# Patient Record
Sex: Female | Born: 2000 | Hispanic: Yes | Marital: Single | Smoking: Never smoker
Health system: Southern US, Community
[De-identification: ages and names within clinical notes are randomized; demographics above are authoritative.]

## PROBLEM LIST (undated history)

## (undated) DIAGNOSIS — N83209 Unspecified ovarian cyst, unspecified side: Secondary | ICD-10-CM

## (undated) HISTORY — PX: WISDOM TOOTH EXTRACTION: SHX21

---
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
## 2018-01-02 ENCOUNTER — Encounter: Payer: Self-pay | Admitting: Advanced Practice Midwife

## 2018-01-02 ENCOUNTER — Ambulatory Visit (INDEPENDENT_AMBULATORY_CARE_PROVIDER_SITE_OTHER): Payer: Medicaid Other | Admitting: Advanced Practice Midwife

## 2018-01-02 ENCOUNTER — Encounter (HOSPITAL_COMMUNITY): Payer: Self-pay

## 2018-01-02 VITALS — BP 126/77 | HR 92 | Ht 62.0 in | Wt 152.0 lb

## 2018-01-02 DIAGNOSIS — D270 Benign neoplasm of right ovary: Secondary | ICD-10-CM | POA: Diagnosis not present

## 2018-01-02 NOTE — Patient Instructions (Signed)
Ovarian Cyst An ovarian cyst is a fluid-filled sac that forms on an ovary. The ovaries are small organs that produce eggs in women. Various types of cysts can form on the ovaries. Some may cause symptoms and require treatment. Most ovarian cysts go away on their own, are not cancerous (are benign), and do not cause problems. Common types of ovarian cysts include:  Functional (follicle) cysts. ? Occur during the menstrual cycle, and usually go away with the next menstrual cycle if you do not get pregnant. ? Usually cause no symptoms.  Endometriomas. ? Are cysts that form from the tissue that lines the uterus (endometrium). ? Are sometimes called "chocolate cysts" because they become filled with blood that turns brown. ? Can cause pain in the lower abdomen during intercourse and during your period.  Cystadenoma cysts. ? Develop from cells on the outside surface of the ovary. ? Can get very large and cause lower abdomen pain and pain with intercourse. ? Can cause severe pain if they twist or break open (rupture).  Dermoid cysts. ? Are sometimes found in both ovaries. ? May contain different kinds of body tissue, such as skin, teeth, hair, or cartilage. ? Usually do not cause symptoms unless they get very big.  Theca lutein cysts. ? Occur when too much of a certain hormone (human chorionic gonadotropin) is produced and overstimulates the ovaries to produce an egg. ? Are most common after having procedures used to assist with the conception of a baby (in vitro fertilization).  What are the causes? Ovarian cysts may be caused by:  Ovarian hyperstimulation syndrome. This is a condition that can develop from taking fertility medicines. It causes multiple large ovarian cysts to form.  Polycystic ovarian syndrome (PCOS). This is a common hormonal disorder that can cause ovarian cysts, as well as problems with your period or fertility.  What increases the risk? The following factors may make  you more likely to develop ovarian cysts:  Being overweight or obese.  Taking fertility medicines.  Taking certain forms of hormonal birth control.  Smoking.  What are the signs or symptoms? Many ovarian cysts do not cause symptoms. If symptoms are present, they may include:  Pelvic pain or pressure.  Pain in the lower abdomen.  Pain during sex.  Abdominal swelling.  Abnormal menstrual periods.  Increasing pain with menstrual periods.  How is this diagnosed? These cysts are commonly found during a routine pelvic exam. You may have tests to find out more about the cyst, such as:  Ultrasound.  X-ray of the pelvis.  CT scan.  MRI.  Blood tests.  How is this treated? Many ovarian cysts go away on their own without treatment. Your health care provider may want to check your cyst regularly for 2-3 months to see if it changes. If you are in menopause, it is especially important to have your cyst monitored closely because menopausal women have a higher rate of ovarian cancer. When treatment is needed, it may include:  Medicines to help relieve pain.  A procedure to drain the cyst (aspiration).  Surgery to remove the whole cyst.  Hormone treatment or birth control pills. These methods are sometimes used to help dissolve a cyst.  Follow these instructions at home:  Take over-the-counter and prescription medicines only as told by your health care provider.  Do not drive or use heavy machinery while taking prescription pain medicine.  Get regular pelvic exams and Pap tests as often as told by your health care   provider.  Return to your normal activities as told by your health care provider. Ask your health care provider what activities are safe for you.  Do not use any products that contain nicotine or tobacco, such as cigarettes and e-cigarettes. If you need help quitting, ask your health care provider.  Keep all follow-up visits as told by your health care provider.  This is important. Contact a health care provider if:  Your periods are late, irregular, or painful, or they stop.  You have pelvic pain that does not go away.  You have pressure on your bladder or trouble emptying your bladder completely.  You have pain during sex.  You have any of the following in your abdomen: ? A feeling of fullness. ? Pressure. ? Discomfort. ? Pain that does not go away. ? Swelling.  You feel generally ill.  You become constipated.  You lose your appetite.  You develop severe acne.  You start to have more body hair and facial hair.  You are gaining weight or losing weight without changing your exercise and eating habits.  You think you may be pregnant. Get help right away if:  You have abdominal pain that is severe or gets worse.  You cannot eat or drink without vomiting.  You suddenly develop a fever.  Your menstrual period is much heavier than usual. This information is not intended to replace advice given to you by your health care provider. Make sure you discuss any questions you have with your health care provider. Document Released: 07/29/2005 Document Revised: 02/16/2016 Document Reviewed: 12/31/2015 Elsevier Interactive Patient Education  2018 [employer]  

## 2018-01-02 NOTE — Progress Notes (Signed)
Patient complaining of right lower quadrant pain for three months. Was evaluated at the hospital. Kathrene Alu RN

## 2018-01-02 NOTE — Progress Notes (Signed)
GYNECOLOGY CLINIC ANNUAL PREVENTATIVE CARE ENCOUNTER NOTE  Subjective:   Beth Vaughn is a 17 y.o. 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  States has pain and pressure in RLQ since Mid-March.  No change in menses.  No fever or other symptoms currently but did have nausea and low appetite before.  Denies abnormal vaginal bleeding, discharge, or other gynecologic concerns.   Was seen in ED in March as described below.  Had [place] and CT diagnosis of a large teratoma on right.  States Medicaid runs out on May 31 due to her age.   ER Note: 17yo female presents with concern for RLQ abdominal pain Intermittent for 2 weeks, lasting 1-2hr at a time and resolving until last night was severe, constant pain that did not improve. Continues to be severe 8-9/10.  Assoc nausea, low appetite. No vomiting, no diarrhea or constipation. Recent menses. No discharge. Not sexually active.    Gynecologic History Patient's last menstrual period was 12/30/2017. Contraception: abstinence Last Pap: none  Obstetric History OB History  Gravida Para Term Preterm AB Living  0 0 0 0 0 0  SAB TAB Ectopic Multiple Live Births  0 0 0 0 0    History reviewed. No pertinent past medical history.  History reviewed. No pertinent surgical history.  Current Outpatient Medications on File Prior to Visit  Medication Sig Dispense Refill  . ondansetron (ZOFRAN ODT) 4 MG disintegrating tablet Take 1 tablet (4 mg total) by mouth every 8 (eight) hours as needed for nausea or vomiting. (Patient not taking: Reported on 01/02/2018) 16 tablet 0   No current facility-administered medications on file prior to visit.     Allergies  Allergen Reactions  . Penicillins     Social History   Socioeconomic History  . Marital status: Single    Spouse name: Not on file  . Number of children: Not on file  . Years of education: Not on file  . Highest education level: Not on file  Occupational History  . Not  on file  Social Needs  . Financial resource strain: Not on file  . Food insecurity:    Worry: Not on file    Inability: Not on file  . Transportation needs:    Medical: Not on file    Non-medical: Not on file  Tobacco Use  . Smoking status: Never Smoker  . Smokeless tobacco: Never Used  Substance and Sexual Activity  . Alcohol use: Never    Frequency: Never  . Drug use: Never  . Sexual activity: Never  Lifestyle  . Physical activity:    Days per week: Not on file    Minutes per session: Not on file  . Stress: Not on file  Relationships  . Social connections:    Talks on phone: Not on file    Gets together: Not on file    Attends religious service: Not on file    Active member of club or organization: Not on file    Attends meetings of clubs or organizations: Not on file    Relationship status: Not on file  . Intimate partner violence:    Fear of current or ex partner: Not on file    Emotionally abused: Not on file    Physically abused: Not on file    Forced sexual activity: Not on file  Other Topics Concern  . Not on file  Social History Narrative  . Not on file    Family History  Problem Relation Age of Onset  . Hypertension Mother   . Cancer Neg Hx   . Diabetes Neg Hx     The following portions of the patient's history were reviewed and updated as appropriate: allergies, current medications, past family history, past medical history, past social history, past surgical history and problem list.  Review of Systems Pertinent items noted in HPI and remainder of comprehensive ROS otherwise negative.   Objective:  BP 126/77   Pulse 92   Ht 5\' 2"  (1.575 m)   Wt 152 lb (68.9 kg)   LMP 12/30/2017   BMI 27.80 kg/m  CONSTITUTIONAL: Well-developed, well-nourished female in no acute distress.  NECK: Normal range of motion, supple, no masses.   SKIN: Skin is warm and dry. No rash noted. Not diaphoretic. No erythema. No pallor. [place]: Alert and oriented to  person, place, and time  PSYCHIATRIC: Normal mood and affect. Normal behavior. Normal judgment and thought content. CARDIOVASCULAR: Normal heart rate noted, regular rhythm RESPIRATORY: Effort normal, no problems with respiration noted. ABDOMEN: Soft, normal bowel sounds, no distention noted.  There is moderate tenderness and fullness in right lower quadrant.  LLQ normal   PELVIC: Deferred today. MUSCULOSKELETAL: Normal range of motion. No tenderness.  No cyanosis, clubbing, or edema.  2+ distal pulses.  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 This has mass effect upon both ureters. The size of this mass may predispose the patient to ovarian torsion. If there is a clinical concern for ovarian torsion pelvic sonogram with ovarian Doppler may be helpful. Otherwise, gynecologic consultation is advised.  [place]: Measurements: 10.9 x 7.7 x 11.5 cm. The patient's known teratoma appears to arise from the right ovary. The teratoma measures 8.3 x 6.2 x 8.9 cm. Left ovary Measurements: 3.3 x 2.2 x 3.1 cm. Normal appearance/no adnexal mass. Pulsed Doppler evaluation demonstrates normal low-resistance arterial and venous waveforms in both ovaries. Other: No other abnormalities.  IMPRESSION: 1. The patient has known teratoma appears to arise from the right ovary. Arterial and venous blood flow is seen within the ovarian tissue.  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

## 2018-01-06 ENCOUNTER — Encounter (HOSPITAL_COMMUNITY): Payer: Self-pay | Admitting: *Deleted

## 2018-01-06 ENCOUNTER — Other Ambulatory Visit: Payer: Self-pay

## 2018-01-07 ENCOUNTER — Encounter: Payer: Self-pay | Admitting: Obstetrics & Gynecology

## 2018-01-07 ENCOUNTER — Ambulatory Visit: Payer: Medicaid Other | Admitting: Obstetrics & Gynecology

## 2018-01-08 ENCOUNTER — Ambulatory Visit (HOSPITAL_COMMUNITY): Payer: Medicaid Other | Admitting: Anesthesiology

## 2018-01-08 ENCOUNTER — Ambulatory Visit (HOSPITAL_COMMUNITY)
Admission: RE | Admit: 2018-01-08 | Discharge: 2018-01-08 | Disposition: A | Discharge status: Home / Self Care | Payer: Medicaid Other | Source: Ambulatory Visit | Attending: Obstetrics & Gynecology | Admitting: Obstetrics & Gynecology

## 2018-01-08 ENCOUNTER — Encounter (HOSPITAL_COMMUNITY): Payer: Self-pay

## 2018-01-08 ENCOUNTER — Encounter (HOSPITAL_COMMUNITY)
Admission: RE | Disposition: A | Discharge status: Home / Self Care | Payer: Self-pay | Source: Ambulatory Visit | Attending: Obstetrics & Gynecology

## 2018-01-08 ENCOUNTER — Other Ambulatory Visit: Payer: Self-pay

## 2018-01-08 DIAGNOSIS — Z880 Allergy status to penicillin: Secondary | ICD-10-CM | POA: Insufficient documentation

## 2018-01-08 DIAGNOSIS — D259 Leiomyoma of uterus, unspecified: Secondary | ICD-10-CM

## 2018-01-08 DIAGNOSIS — D270 Benign neoplasm of right ovary: Secondary | ICD-10-CM

## 2018-01-08 HISTORY — PX: LAPAROSCOPIC OVARIAN CYSTECTOMY: SHX6248

## 2018-01-08 HISTORY — DX: Unspecified ovarian cyst, unspecified side: N83.209

## 2018-01-08 LAB — CBC
HEMATOCRIT: 41.5 % (ref 36.0–46.0)
Hemoglobin: 14 g/dL (ref 12.0–15.0)
MCH: 31.2 pg (ref 26.0–34.0)
MCHC: 33.7 g/dL (ref 30.0–36.0)
MCV: 92.4 fL (ref 78.0–100.0)
Platelets: 198 10*3/uL (ref 150–400)
RBC: 4.49 MIL/uL (ref 3.87–5.11)
RDW: 12 % (ref 11.5–15.5)
WBC: 8.6 10*3/uL (ref 4.0–10.5)

## 2018-01-08 LAB — PREGNANCY, URINE: PREG TEST UR: NEGATIVE

## 2018-01-08 SURGERY — EXCISION, CYST, OVARY, LAPAROSCOPIC
Anesthesia: General | Site: Abdomen | Laterality: Right

## 2018-01-08 MED ORDER — MIDAZOLAM HCL 2 MG/2ML IJ SOLN
INTRAMUSCULAR | Status: AC
  Filled 2018-01-08: qty 2

## 2018-01-08 MED ORDER — SCOPOLAMINE 1 MG/3DAYS TD PT72
MEDICATED_PATCH | TRANSDERMAL | Status: AC
  Administered 2018-01-08: 1.5 mg via TRANSDERMAL
  Filled 2018-01-08: qty 1

## 2018-01-08 MED ORDER — SODIUM CHLORIDE 0.9 % IR SOLN
Status: DC | PRN
  Administered 2018-01-08: 3000 mL

## 2018-01-08 MED ORDER — PROPOFOL 10 MG/ML IV BOLUS
INTRAVENOUS | Status: DC | PRN
  Administered 2018-01-08: 150 mg via INTRAVENOUS

## 2018-01-08 MED ORDER — LIDOCAINE HCL (CARDIAC) PF 100 MG/5ML IV SOSY
PREFILLED_SYRINGE | INTRAVENOUS | Status: DC | PRN
  Administered 2018-01-08: 80 mg via INTRAVENOUS

## 2018-01-08 MED ORDER — ONDANSETRON HCL 4 MG/2ML IJ SOLN: 4.0000 mg | Freq: Once | INTRAMUSCULAR | Status: DC | PRN

## 2018-01-08 MED ORDER — ROCURONIUM BROMIDE 100 MG/10ML IV SOLN
INTRAVENOUS | Status: AC
  Filled 2018-01-08: qty 1

## 2018-01-08 MED ORDER — MIDAZOLAM HCL 2 MG/2ML IJ SOLN
INTRAMUSCULAR | Status: DC | PRN
  Administered 2018-01-08: 2 mg via INTRAVENOUS

## 2018-01-08 MED ORDER — FENTANYL CITRATE (PF) 250 MCG/5ML IJ SOLN
INTRAMUSCULAR | Status: AC
  Filled 2018-01-08: qty 5

## 2018-01-08 MED ORDER — LIDOCAINE HCL (CARDIAC) PF 100 MG/5ML IV SOSY
PREFILLED_SYRINGE | INTRAVENOUS | Status: AC
  Filled 2018-01-08: qty 5

## 2018-01-08 MED ORDER — ONDANSETRON HCL 4 MG/2ML IJ SOLN
INTRAMUSCULAR | Status: AC
  Filled 2018-01-08: qty 2

## 2018-01-08 MED ORDER — HYDROMORPHONE HCL 1 MG/ML IJ SOLN
INTRAMUSCULAR | Status: DC | PRN
  Administered 2018-01-08: 1 mg via INTRAVENOUS

## 2018-01-08 MED ORDER — OXYCODONE HCL 5 MG/5ML PO SOLN: 5.0000 mg | Freq: Once | ORAL | Status: DC | PRN

## 2018-01-08 MED ORDER — FENTANYL CITRATE (PF) 100 MCG/2ML IJ SOLN
25.0000 ug | INTRAMUSCULAR | Status: DC | PRN
  Administered 2018-01-08: 50 ug via INTRAVENOUS

## 2018-01-08 MED ORDER — FENTANYL CITRATE (PF) 100 MCG/2ML IJ SOLN
INTRAMUSCULAR | Status: AC
  Filled 2018-01-08: qty 2

## 2018-01-08 MED ORDER — BUPIVACAINE HCL (PF) 0.5 % IJ SOLN
INTRAMUSCULAR | Status: DC | PRN
  Administered 2018-01-08: 14 mL
  Administered 2018-01-08: 2 mL
  Administered 2018-01-08: 14 mL

## 2018-01-08 MED ORDER — MEPERIDINE HCL 25 MG/ML IJ SOLN: 6.2500 mg | INTRAMUSCULAR | Status: DC | PRN

## 2018-01-08 MED ORDER — ACETAMINOPHEN 160 MG/5ML PO SOLN: 325.0000 mg | ORAL | Status: DC | PRN

## 2018-01-08 MED ORDER — KETOROLAC TROMETHAMINE 30 MG/ML IJ SOLN
INTRAMUSCULAR | Status: DC | PRN
  Administered 2018-01-08: 30 mg via INTRAVENOUS

## 2018-01-08 MED ORDER — GENTAMICIN SULFATE 40 MG/ML IJ SOLN
INTRAVENOUS | Status: AC
  Administered 2018-01-08: 290 mg via INTRAVENOUS
  Filled 2018-01-08: qty 7.25

## 2018-01-08 MED ORDER — BUPIVACAINE HCL (PF) 0.5 % IJ SOLN
INTRAMUSCULAR | Status: AC
  Filled 2018-01-08: qty 30

## 2018-01-08 MED ORDER — PROPOFOL 10 MG/ML IV BOLUS
INTRAVENOUS | Status: AC
  Filled 2018-01-08: qty 20

## 2018-01-08 MED ORDER — SOD CITRATE-CITRIC ACID 500-334 MG/5ML PO SOLN
30.0000 mL | ORAL | Status: AC
  Administered 2018-01-08: 30 mL via ORAL

## 2018-01-08 MED ORDER — HYDROMORPHONE HCL 1 MG/ML IJ SOLN
INTRAMUSCULAR | Status: AC
  Filled 2018-01-08: qty 1

## 2018-01-08 MED ORDER — SUGAMMADEX SODIUM 200 MG/2ML IV SOLN
INTRAVENOUS | Status: DC | PRN
  Administered 2018-01-08: 200 mg via INTRAVENOUS

## 2018-01-08 MED ORDER — SCOPOLAMINE 1 MG/3DAYS TD PT72
1.0000 | MEDICATED_PATCH | Freq: Once | TRANSDERMAL | Status: DC
  Administered 2018-01-08: 1.5 mg via TRANSDERMAL

## 2018-01-08 MED ORDER — IBUPROFEN 600 MG PO TABS: 600.0000 mg | ORAL_TABLET | Freq: Four times a day (QID) | ORAL | 2 refills | Status: AC | PRN

## 2018-01-08 MED ORDER — DEXAMETHASONE SODIUM PHOSPHATE 4 MG/ML IJ SOLN
INTRAMUSCULAR | Status: AC
  Filled 2018-01-08: qty 1

## 2018-01-08 MED ORDER — DEXAMETHASONE SODIUM PHOSPHATE 4 MG/ML IJ SOLN
INTRAMUSCULAR | Status: DC | PRN
  Administered 2018-01-08: 4 mg via INTRAVENOUS

## 2018-01-08 MED ORDER — OXYCODONE-ACETAMINOPHEN 5-325 MG PO TABS: 1.0000 | ORAL_TABLET | ORAL | 0 refills | Status: DC | PRN

## 2018-01-08 MED ORDER — SUGAMMADEX SODIUM 200 MG/2ML IV SOLN
INTRAVENOUS | Status: AC
  Filled 2018-01-08: qty 2

## 2018-01-08 MED ORDER — ROCURONIUM BROMIDE 100 MG/10ML IV SOLN
INTRAVENOUS | Status: DC | PRN
  Administered 2018-01-08 (×2): 10 mg via INTRAVENOUS
  Administered 2018-01-08: 5 mg via INTRAVENOUS
  Administered 2018-01-08: 40 mg via INTRAVENOUS

## 2018-01-08 MED ORDER — ONDANSETRON 4 MG PO TBDP: 4.0000 mg | ORAL_TABLET | Freq: Four times a day (QID) | ORAL | 0 refills | Status: DC | PRN

## 2018-01-08 MED ORDER — LACTATED RINGERS IV SOLN: INTRAVENOUS | Status: DC

## 2018-01-08 MED ORDER — ONDANSETRON HCL 4 MG/2ML IJ SOLN
INTRAMUSCULAR | Status: DC | PRN
Start: 2018-01-08 — End: 2018-01-08
  Administered 2018-01-08: 4 mg via INTRAVENOUS

## 2018-01-08 MED ORDER — SUGAMMADEX SODIUM 200 MG/2ML IV SOLN
INTRAVENOUS | Status: AC
  Filled 2018-01-08: qty 4

## 2018-01-08 MED ORDER — FENTANYL CITRATE (PF) 100 MCG/2ML IJ SOLN
INTRAMUSCULAR | Status: DC | PRN
  Administered 2018-01-08 (×2): 50 ug via INTRAVENOUS
  Administered 2018-01-08: 100 ug via INTRAVENOUS
  Administered 2018-01-08: 50 ug via INTRAVENOUS
  Administered 2018-01-08: 100 ug via INTRAVENOUS

## 2018-01-08 MED ORDER — DOCUSATE SODIUM 100 MG PO CAPS: 100.0000 mg | ORAL_CAPSULE | Freq: Two times a day (BID) | ORAL | 2 refills | Status: DC | PRN

## 2018-01-08 MED ORDER — LACTATED RINGERS IV SOLN
INTRAVENOUS | Status: DC
  Administered 2018-01-08: 16:00:00 via INTRAVENOUS
  Administered 2018-01-08: 125 mL/h via INTRAVENOUS
  Administered 2018-01-08: 1000 mL via INTRAVENOUS

## 2018-01-08 MED ORDER — SOD CITRATE-CITRIC ACID 500-334 MG/5ML PO SOLN
ORAL | Status: AC
  Administered 2018-01-08: 30 mL via ORAL
  Filled 2018-01-08: qty 15

## 2018-01-08 MED ORDER — OXYCODONE HCL 5 MG PO TABS: 5.0000 mg | ORAL_TABLET | Freq: Once | ORAL | Status: DC | PRN

## 2018-01-08 MED ORDER — ACETAMINOPHEN 325 MG PO TABS: 325.0000 mg | ORAL_TABLET | ORAL | Status: DC | PRN

## 2018-01-08 SURGICAL SUPPLY — 30 items
CABLE HIGH FREQUENCY MONO STRZ (ELECTRODE) ×3 IMPLANT
DERMABOND ADVANCED (GAUZE/BANDAGES/DRESSINGS) ×2
DERMABOND ADVANCED .7 DNX12 (GAUZE/BANDAGES/DRESSINGS) ×1 IMPLANT
DRSG OPSITE POSTOP 3X4 (GAUZE/BANDAGES/DRESSINGS) ×3 IMPLANT
DURAPREP 26ML APPLICATOR (WOUND CARE) ×3 IMPLANT
GLOVE BIOGEL PI IND STRL 7.0 (GLOVE) ×4 IMPLANT
GLOVE BIOGEL PI INDICATOR 7.0 (GLOVE) ×8
GLOVE ECLIPSE 7.0 STRL STRAW (GLOVE) ×3 IMPLANT
GOWN STRL REUS W/TWL LRG LVL3 (GOWN DISPOSABLE) ×9 IMPLANT
NEEDLE INSUFFLATION 120MM (ENDOMECHANICALS) ×3 IMPLANT
NS IRRIG 1000ML POUR BTL (IV SOLUTION) ×3 IMPLANT
PACK LAPAROSCOPY BASIN (CUSTOM PROCEDURE TRAY) ×3 IMPLANT
PACK TRENDGUARD 450 HYBRID PRO (MISCELLANEOUS) ×1 IMPLANT
POUCH ENDO CATCH II 15MM (MISCELLANEOUS) ×3 IMPLANT
POUCH SPECIMEN RETRIEVAL 10MM (ENDOMECHANICALS) IMPLANT
PROTECTOR NERVE ULNAR (MISCELLANEOUS) ×6 IMPLANT
SET IRRIG TUBING LAPAROSCOPIC (IRRIGATION / IRRIGATOR) ×9 IMPLANT
SHEARS HARMONIC ACE PLUS 36CM (ENDOMECHANICALS) ×3 IMPLANT
SLEEVE XCEL OPT CAN 5 100 (ENDOMECHANICALS) ×6 IMPLANT
SUT VIC AB 2-0 SH 27 (SUTURE) ×2
SUT VIC AB 2-0 SH 27XBRD (SUTURE) ×1 IMPLANT
SUT VIC AB 3-0 X1 27 (SUTURE) ×3 IMPLANT
SUT VICRYL 0 UR6 27IN ABS (SUTURE) ×6 IMPLANT
SUT VICRYL 4-0 PS2 18IN ABS (SUTURE) ×6 IMPLANT
TOWEL OR 17X24 6PK STRL BLUE (TOWEL DISPOSABLE) ×6 IMPLANT
TRAY FOLEY W/BAG SLVR 14FR (SET/KITS/TRAYS/PACK) ×3 IMPLANT
TRENDGUARD 450 HYBRID PRO PACK (MISCELLANEOUS) ×3
TROCAR XCEL NON-BLD 11X100MML (ENDOMECHANICALS) ×3 IMPLANT
TROCAR XCEL NON-BLD 5MMX100MML (ENDOMECHANICALS) ×3 IMPLANT
WARMER LAPAROSCOPE (MISCELLANEOUS) ×3 IMPLANT

## 2018-01-08 NOTE — Discharge Instructions (Addendum)
Ovarian Cystectomy, Care After Refer to this sheet in the next few weeks. These instructions provide you with information on caring for yourself after your procedure. Your health care provider may also give you more specific instructions. Your treatment has been planned according to current medical practices, but problems sometimes occur. Call your health care provider if you have any problems or questions after your procedure. What can I expect after the procedure? After your procedure, it is typical to have the following:  Pain in your abdomen, especially at the incision sites. You will be given pain medicines to control the pain.  Tiredness. This is a normal part of the recovery process. Your energy level will return to normal over the next several weeks.  Constipation.  Follow these instructions at home:  Only take over-the-counter or prescription medicines as directed by your health care provider. Avoid taking aspirin because it can cause bleeding.  Follow your health care provider's instructions for when to resume your regular diet, exercise, and activities.  Take rest breaks during the day as needed.  Do not douche or have sexual intercourse until you have permission from your health care provider.  Remove or change any bandages (dressings) as directed by your health care provider.  Do not drive until your health care provider approves.  Take showers instead of baths until your health care provider tells you otherwise.  If you become constipated, you may: ? Use a mild laxative if your health care provider approves. ? Add more fruit and bran to your diet. ? Drink more fluids.  Take your temperature twice a day and record it.  Do not drink alcohol while taking pain medicine.  Try to have someone home with you for the first 1-2 weeks to help with your household activities.  Follow up with your health care provider as directed. Contact a health care provider if:  You have  a fever.  You feel sick to your stomach (nauseous) and throw up (vomit).  You have redness, swelling, or leakage of fluid at the incision site.  You have pain when you urinate or have blood in your urine.  You have a rash on your body.  You have pain or redness where the IV tube was inserted.  You have pain that is not relieved with medicine. Get help right away if:  You have chest pain or shortness of breath.  You feel dizzy or lightheaded.  You have increasing abdominal pain that is not relieved with medicines.  You have pain, swelling, or redness in your leg.  You see a yellowish white fluid (pus) coming from the incision.  Your incision is opening (edges not staying together). This information is not intended to replace advice given to you by your health care provider. Make sure you discuss any questions you have with your health care provider. Document Released: 05/19/2013 Document Revised: 01/04/2016 Document Reviewed: 03/10/2013 Elsevier Interactive Patient Education  2017 [facility] Care Instructions  NO IBUPROFEN PRODUCTS  UNTIL: 11:00 PM TONIGHT  Activity: Get plenty of rest for the remainder of the day. A responsible individual must stay with you for 24 hours following the procedure.  For the next 24 hours, DO NOT: -Drive a car -Paediatric nurse -Drink alcoholic beverages -Take any medication unless instructed by your physician -Make any legal decisions or sign important papers.  Meals: Start with liquid foods such as gelatin or soup. Progress to regular foods as tolerated. Avoid greasy, spicy, heavy foods. If  nausea and/or vomiting occur, drink only clear liquids until the nausea and/or vomiting subsides. Call your physician if vomiting continues.  Special Instructions/Symptoms: Your throat may feel dry or sore from the anesthesia or the breathing tube placed in your throat during surgery. If this causes discomfort, gargle with  warm salt water. The discomfort should disappear within 24 hours.  If you had a scopolamine patch placed behind your ear for the management of post- operative nausea and/or vomiting:  1. The medication in the patch is effective for 72 hours, after which it should be removed.  Wrap patch in a tissue and discard in the trash. Wash hands thoroughly with soap and water. 2. You may remove the patch earlier than 72 hours if you experience unpleasant side effects which may include dry mouth, dizziness or visual disturbances. 3. Avoid touching the patch. Wash your hands with soap and water after contact with the patch.

## 2018-01-08 NOTE — Transfer of Care (Signed)
Immediate Anesthesia Transfer of Care Note  Patient: Beth Vaughn  Procedure(s) Performed: LAPAROSCOPIC OVARIAN CYSTECTOMY (Right Abdomen)  Patient Location: PACU  Anesthesia Type:General  Level of Consciousness: awake, alert  and oriented  Airway & Oxygen Therapy: Patient Spontanous Breathing and Patient connected to nasal cannula oxygen  Post-op Assessment: Report given to RN and Post -op Vital signs reviewed and stable  Post vital signs: Reviewed and stable HR 99, RR 20, SaO2 100%, BP 129/81  Last Vitals:  Vitals Value Taken Time  BP    Temp    Pulse 98 01/08/2018  5:35 PM  Resp    SpO2 100 % 01/08/2018  5:35 PM  Vitals shown include unvalidated device data.  Last Pain:  Vitals:   01/08/18 1356  TempSrc: Oral  PainSc: 0-No pain      Patients Stated Pain Goal: 5 (42/68/34 1962)  Complications: No apparent anesthesia complications

## 2018-01-08 NOTE — Op Note (Signed)
Beth Vaughn PROCEDURE DATE: 01/08/2018  PREOPERATIVE DIAGNOSIS: Large right ovarian dermoid cyst POSTOPERATIVE DIAGNOSIS: The same PROCEDURE: Laparoscopic right ovarian cystectomy; minilaparotomy incison for removal of cyst from abdomen SURGEON:  Dr. Verita Schneiders ASSISTANT: Dr. Darron Doom.  An experienced assistant was required given the standard of surgical care given the complexity of the case.  This assistant was needed for exposure, dissection, suctioning, retraction, instrument exchange, and for overall help during the procedure.  ANESTHESIOLOGIST: Dr. Janeece Riggers  INDICATIONS: 17 y.o. G0 with aforementioned preoperative diagnosis here today for definitive surgical management.   Risks of surgery were discussed with the patient including but not limited to: bleeding which may require transfusion or reoperation; infection which may require antibiotics; injury to bowel, bladder, ureters or other surrounding organs; need for additional procedures including laparotomy; thromboembolic phenomenon, incisional problems and other postoperative/anesthesia complications. Written informed consent was obtained.    FINDINGS:  Small uterus, right adnexa with 10 cm large ovarian dermoid cyst. Cyst contents noted to be fat, bone and hair.  In order to minimize intraperitoneal spillage of the cyst contents and because of the large bony component of the cyst, a 4 cm minilaparotomy incision was made to remove the cyst out of the abdomen.  Normal left adnexa .  No evidence of endometriosis, adhesions or any other abdominal/pelvic abnormality.  Normal upper abdomen.  ANESTHESIA:    General ESTIMATED BLOOD LOSS: 20 ml SPECIMENS: Right ovarian cyst COMPLICATIONS: None immediate   PROCEDURE IN DETAIL:  The patient received intravenous antibiotics and had sequential compression devices applied to her lower extremities while in the preoperative area.  She was then taken to the operating room where general  anesthesia was administered and was found to be adequate.  She was placed in the dorsal lithotomy position, and was prepped and draped in a sterile manner.  A Foley catheter was inserted into her bladder and attached to constant drainage and a uterine manipulator was then advanced into the uterus . After an adequate timeout was performed, attention was then turned to the patient's abdomen where a 15-mm skin incision was made in the umbilical fold.  The Optiview 15-mm trocar and sleeve were then advanced without difficulty with the laparoscope under direct visualization into the abdomen.  The abdomen was then insufflated with carbon dioxide gas.  Adequate pneumoperitoneum was obtained.  A survey of the patient's pelvis and abdomen revealed the findings above. Bilateral 5-mm lower quadrant ports were then placed under direct visualization. On the right side, the large ovary containing the dermoid was noted.  The large kidney Endocatch bag was then placed through the umbilical port and the ovary was placed inside the bag.  The Nezhat needle point device was then used to make a small hole into the cyst, and the a small amount of the contents was aspirated.  The Nezhat suction kept getting clogged up with fat and hair, and a large amount of bone was encountered in the cyst. After multiple futile attempts to suction out more contents, the decision was made to make a minilaparotomy incision to help in evacuation of cyst contents and for removal of the cyst.  A mini-pfannenstiel (4 cm) incision was made with the scapel and carried down to fascial level.  Fascia was incised, rectus muscles separated and peritoneum was entered. The large ovary was immediately encountered and brought up to the surface. After more evacuation of cyst contents, the cyst was peeled away from the surrounding ovary and was removed, including the  solid bony part which was able to pass through the mini-laparotomy incision. The stretched out ovarian  remnant was reapproximated using a 2-0 Vicryl running stitch; there was good hemostasis noted.  Copious irrigation was done, and the fascia was closed with 0 Vicryl running stitch. The skin was closed with a 4-0 Monocryl subcuticular stitch and Dermabond.  The abdomen was insufflated again and the operative site was surveyed again, and it was found to be hemostatic.  No intraoperative injury to other surrounding organs was noted. Copious irrigation with with several liters of normal saline was done with the Nezhat system.  The abdomen was desufflated and all instruments were then removed from the patient's abdomen. The fascial incision of the umbilicus was closed with a 0 Vicryl figure of eight stitch.  All skin incisions were closed with 4-0 Monocryl subcuticular stitches and Dermabond.   The patient will be discharged to home as per PACU criteria.  Routine postoperative instructions given.  She was prescribed Percocet, Ibuprofen, Zofran and Colace.  She will follow up in the clinic in about 3 weeks for postoperative evaluation.    Verita Schneiders, MD, [place] for [employer], [place]

## 2018-01-08 NOTE — Anesthesia Postprocedure Evaluation (Signed)
Anesthesia Post Note  Patient: Beth Vaughn  Procedure(s) Performed: LAPAROSCOPIC OVARIAN CYSTECTOMY (Right Abdomen)     Patient location during evaluation: PACU Anesthesia Type: General Level of consciousness: awake and alert Pain management: pain level controlled Vital Signs Assessment: post-procedure vital signs reviewed and stable Respiratory status: spontaneous breathing, nonlabored ventilation, respiratory function stable and patient connected to nasal cannula oxygen Cardiovascular status: blood pressure returned to baseline and stable Postop Assessment: no apparent nausea or vomiting Anesthetic complications: no    Last Vitals:  Vitals:   01/08/18 1356  BP: (!) 128/93  Pulse: 97  Resp: 18  Temp: 37.1 C  SpO2: 100%    Last Pain:  Vitals:   01/08/18 1356  TempSrc: Oral  PainSc: 0-No pain   Pain Goal: Patients Stated Pain Goal: 5 (01/08/18 1356)               [name]

## 2018-01-08 NOTE — H&P (Signed)
Preoperative History and Physical  Beth Vaughn is a 17 y.o. G0 here for surgical management of large ovarian dermoid cyst.   No significant preoperative concerns.  Proposed surgery: Laparoscopic ovarian cystectomy, possible laparotomy  Past Medical History:  Diagnosis Date  . Ovarian cyst    Past Surgical History:  Procedure Laterality Date  . WISDOM TOOTH EXTRACTION     OB History  Gravida Para Term Preterm AB Living  0 0 0 0 0 0  SAB TAB Ectopic Multiple Live Births  0 0 0 0 0  Patient denies any other pertinent gynecologic issues.   No current facility-administered medications on file prior to encounter.    Current Outpatient Medications on File Prior to Encounter  Medication Sig Dispense Refill  . acetaminophen (TYLENOL) 500 MG tablet Take 500 mg by mouth every 6 (six) hours as needed for moderate pain or headache.    . ondansetron (ZOFRAN ODT) 4 MG disintegrating tablet Take 1 tablet (4 mg total) by mouth every 8 (eight) hours as needed for nausea or vomiting. (Patient not taking: Reported on 01/02/2018) 16 tablet 0   Allergies  Allergen Reactions  . Penicillins Shortness Of Breath, Swelling and Other (See Comments)    Childhood allergy Has patient had a PCN reaction causing immediate rash, facial/tongue/throat swelling, SOB or lightheadedness with hypotension: Unknown Has patient had a PCN reaction causing severe rash involving mucus membranes or skin necrosis: No Has patient had a PCN reaction that required hospitalization: Yes Has patient had a PCN reaction occurring within the last 10 years: No If all of the above answers are "NO", then may proceed with Cephalosporin use.    Social History:   reports that she has never smoked. She has never used smokeless tobacco. She reports that she does not drink alcohol or use drugs.  Family History  Problem Relation Age of Onset  . Hypertension Mother   . Cancer Neg Hx   . Diabetes Neg Hx     Review of Systems:  Pertinent items noted in HPI and remainder of comprehensive ROS otherwise negative.  PHYSICAL EXAM: Blood pressure (!) 128/93, pulse 97, temperature 98.8 F (37.1 C), temperature source Oral, resp. rate 18, height 5\' 2"  (1.575 m), weight 152 lb (68.9 kg), last menstrual period 12/30/2017, SpO2 100 %. CONSTITUTIONAL: Well-developed, well-nourished female in no acute distress.  HENT:  Normocephalic, atraumatic, External right and left ear normal. Oropharynx is clear and moist EYES: Conjunctivae and EOM are normal. Pupils are equal, round, and reactive to light. No scleral icterus.  NECK: Normal range of motion, supple, no masses SKIN: Skin is warm and dry. No rash noted. Not diaphoretic. No erythema. No pallor. NEUROLOGIC: Alert and oriented to person, place, and time. Normal reflexes, muscle tone coordination. No cranial nerve deficit noted. PSYCHIATRIC: Normal mood and affect. Normal behavior. Normal judgment and thought content. CARDIOVASCULAR: Normal heart rate noted, regular rhythm RESPIRATORY: Effort and breath sounds normal, no problems with respiration noted ABDOMEN: Soft, nontender, nondistended. PELVIC: Deferred MUSCULOSKELETAL: Normal range of motion. No edema and no tenderness. 2+ distal pulses.  Labs: Results for orders placed or performed during the hospital encounter of 01/08/18 (from the past 336 hour(s))  Pregnancy, urine   Collection Time: 01/08/18  1:15 PM  Result Value Ref Range   Preg Test, Ur NEGATIVE NEGATIVE  CBC   Collection Time: 01/08/18  1:40 PM  Result Value Ref Range   WBC 8.6 4.0 - 10.5 K/uL   RBC 4.49 3.87 - 5.11  MIL/uL   Hemoglobin 14.0 12.0 - 15.0 g/dL   HCT 41.5 36.0 - 46.0 %   MCV 92.4 78.0 - 100.0 fL   MCH 31.2 26.0 - 34.0 pg   MCHC 33.7 30.0 - 36.0 g/dL   RDW 12.0 11.5 - 15.5 %   Platelets 198 150 - 400 K/uL    Imaging Studies: 11/08/2017  CT ABDOMEN AND PELVIS WITH CONTRAST CLINICAL DATA:  Right lower quadrant pain.  Nausea. COMPARISON:   None FINDINGS: Lower chest: No acute abnormality. Hepatobiliary: No focal liver abnormality is seen. No gallstones, gallbladder wall thickening, or biliary dilatation. Pancreas: Unremarkable. No pancreatic ductal dilatation or surrounding inflammatory changes. Spleen: Normal in size without focal abnormality. Adrenals/Urinary Tract: Adrenal glands are unremarkable. Kidneys are normal, without renal calculi, focal lesion, or hydronephrosis. Bilateral pelvocaliectasis is identified. Urinary bladder appears normal. Stomach/Bowel: Stomach is within normal limits. Appendix appears normal. No evidence of bowel wall thickening, distention, or inflammatory changes. Vascular/Lymphatic: No significant vascular findings are present. No enlarged abdominal or pelvic lymph nodes. Reproductive: Uterus appears normal. There is a large teratoma identified within the lower abdomen and pelvis measuring 9.8 x 6.6 by 8.5 cm (volume = 290 cm^3), image 52/3. This appears to be arising from the right ovary, image 55/3. There is associated mass effect upon both ureters likely accounting for bilateral pelvicaliectasis. Other: No free fluid or fluid collections identified. Musculoskeletal: No acute or significant osseous findings.  IMPRESSION: 1. 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 This has mass effect upon both ureters. The size of this mass may predispose the patient to ovarian torsion. If there is a clinical concern for ovarian torsion pelvic sonogram with ovarian Doppler may be helpful. Otherwise, gynecologic consultation is advised. 2. No evidence for acute appendicitis.  11/08/2017  TRANSABDOMINAL ULTRASOUND OF PELVIS CLINICAL DATA:  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 Evaluate for torsion. Pain.  COMPARISON:  None. FINDINGS: UterusMeasurements: 7.7 x 2.1 x 3.8 cm. No fibroids or other mass visualized. EndometriumThickness: 2 mm.  No focal abnormality  visualized. Right ovaryMeasurements: 10.9 x 7.7 x 11.5 cm. The patient's known teratoma appears to arise from the right ovary. The teratoma measures 8.3 x 6.2 x 8.9 cm. Left ovary Measurements: 3.3 x 2.2 x 3.1 cm. Normal appearance/no adnexal mass. Pulsed Doppler evaluation demonstrates normal low-resistance arterial and venous waveforms in both ovaries. Other: No other abnormalities. IMPRESSION: 1. The patient has known teratoma appears to arise from the right ovary. Arterial and venous blood flow is seen within the ovarian tissue. 2. No other abnormalities.   Assessment: Patient Active Problem List   Diagnosis Date Noted  . 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 Left ankle injury, initial encounter 10/09/2016    Plan: Patient will undergo surgical management with laparoscopic ovarian cystectomy, possible laparotomy.   The risks of surgery were discussed in detail with the patient including but not limited to: bleeding which may require transfusion or reoperation; infection which may require antibiotics; injury to surrounding organs which may involve bowel, bladder, ureters ; need for additional procedures including laparotomy; thromboembolic phenomenon, surgical site problems and other postoperative/anesthesia complications. Likelihood of success in alleviating the patient's condition was discussed. Routine postoperative instructions will be reviewed with the patient and her family in detail after surgery.  The patient concurred with the proposed plan, giving informed written consent for the surgery.  Patient has been NPO since last night and she will remain NPO for procedure.  Anesthesia and  OR aware.  Preoperative prophylactic antibiotics and SCDs ordered on call to the OR.  To OR when ready.    Verita Schneiders, MD, [place] for [employer], [place]

## 2018-01-08 NOTE — Anesthesia Procedure Notes (Signed)
Procedure Name: Intubation Date/Time: 01/08/2018 3:10 PM Performed by: Elenore Paddy, CRNA Pre-anesthesia Checklist: Patient identified, Emergency Drugs available, Suction available, Patient being monitored and Timeout performed Patient Re-evaluated:Patient Re-evaluated prior to induction Oxygen Delivery Method: Circle system utilized Preoxygenation: Pre-oxygenation with 100% oxygen Induction Type: IV induction Ventilation: Mask ventilation without difficulty Laryngoscope Size: Mac and 3 Grade View: Grade I Tube type: Oral Tube size: 7.0 mm Number of attempts: 1 Airway Equipment and Method: Stylet Secured at: 20 cm Tube secured with: Tape Dental Injury: Teeth and Oropharynx as per pre-operative assessment

## 2018-01-08 NOTE — Anesthesia Preprocedure Evaluation (Signed)
Anesthesia Evaluation  Patient identified by MRN, date of birth, ID band Patient awake    Reviewed: Allergy & Precautions, H&P , NPO status , Patient's Chart, lab work & pertinent test results, reviewed documented beta blocker date and time   Airway Mallampati: II  TM Distance: >3 FB Neck ROM: full    Dental no notable dental hx.    Pulmonary neg pulmonary ROS,    Pulmonary exam normal breath sounds clear to auscultation       Cardiovascular Exercise Tolerance: Good negative cardio ROS   Rhythm:regular Rate:Normal     Neuro/Psych negative neurological ROS  negative psych ROS   GI/Hepatic negative GI ROS, Neg liver ROS,   Endo/Other  negative endocrine ROS  Renal/GU negative Renal ROS  negative genitourinary   Musculoskeletal   Abdominal   Peds  Hematology negative hematology ROS (+)   Anesthesia Other Findings   Reproductive/Obstetrics negative OB ROS                             Anesthesia Physical Anesthesia Plan  ASA: II  Anesthesia Plan: General   Post-op Pain Management:    Induction: Intravenous  PONV Risk Score and Plan: 3 and Ondansetron, Dexamethasone, Treatment may vary due to age or medical condition and Scopolamine patch - Pre-op  Airway Management Planned: Oral ETT and LMA  Additional Equipment:   Intra-op Plan:   Post-operative Plan: Extubation in OR  Informed Consent: I have reviewed the patients History and Physical, chart, labs and discussed the procedure including the risks, benefits and alternatives for the proposed anesthesia with the patient or authorized representative who has indicated his/her understanding and acceptance.   Dental Advisory Given  Plan Discussed with: CRNA, Anesthesiologist and Surgeon  Anesthesia Plan Comments: (  )        Anesthesia Quick Evaluation

## 2018-01-09 ENCOUNTER — Encounter (HOSPITAL_COMMUNITY): Payer: Self-pay | Admitting: Obstetrics & Gynecology

## 2018-01-12 ENCOUNTER — Encounter: Payer: Medicaid Other | Admitting: Obstetrics & Gynecology

## 2018-04-27 ENCOUNTER — Encounter: Payer: Self-pay | Admitting: Obstetrics & Gynecology

## 2018-04-27 ENCOUNTER — Ambulatory Visit (INDEPENDENT_AMBULATORY_CARE_PROVIDER_SITE_OTHER): Payer: Medicaid Other | Admitting: Obstetrics & Gynecology

## 2018-04-27 VITALS — BP 113/62 | HR 67 | Ht 62.0 in | Wt 153.0 lb

## 2018-04-27 DIAGNOSIS — R102 Pelvic and perineal pain: Secondary | ICD-10-CM | POA: Diagnosis not present

## 2018-04-27 NOTE — Progress Notes (Signed)
Patient following up from surgery on May 30th 2019 for dermoid cyst. Patient complaining of pain on right side.

## 2018-04-27 NOTE — Progress Notes (Signed)
History:  17 y.o. G0P0000 here today for eval of pain on right side. Pt is s/p resection of dermoid cyst on right side 01/08/2018 by Dr. Harolyn Rutherford. Pt did not get a post op eval. She reports that she doesn't remember why but, thinks that she went out of town and never rescheduled the appt. She reports that she is now having 'the same pain that she had prev.'' She descries it as an occ shooting pain. Not severe. Pt occ takes NSADIS for the pain but, it is usually fleeting and does not require meds.  Pt is not sexually active and has no h/o sexual activity.         The following portions of the patient's history were reviewed and updated as appropriate: allergies, current medications, past family history, past medical history, past social history, past surgical history and problem list.  Review of Systems:  Pertinent items are noted in HPI.    Objective:  Physical Exam Blood pressure 113/62, pulse 67, height 5\' 2"  (1.575 m), weight 153 lb (69.4 kg), last menstrual period 04/20/2018.  CONSTITUTIONAL: Well-developed, well-nourished female in no acute distress.  HENT:  Normocephalic, atraumatic EYES: Conjunctivae and EOM are normal. No scleral icterus.  NECK: Normal range of motion SKIN: Skin is warm and dry. No rash noted. Not diaphoretic.No pallor. [place]: Alert and oriented to person, place, and time. Normal coordination.  Abd: Soft, nontender and nondistended Pelvic: Normal appearing external genitalia; normal appearing vaginal mucosa and cervix.  Normal discharge.  Small uterus, no palpable masses, no uterine or adnexal tenderness  Labs and Imaging 11/08/2017 CLINICAL DATA:  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 Evaluate for torsion. Pain.  EXAM: TRANSABDOMINAL ULTRASOUND OF PELVIS  DOPPLER ULTRASOUND OF OVARIES  TECHNIQUE: Transabdominal ultrasound examination of the pelvis was performed including evaluation of the uterus, ovaries, adnexal regions, and pelvic cul-de-sac.  Color  and duplex Doppler ultrasound was utilized to evaluate blood flow to the ovaries.  COMPARISON:  None.  FINDINGS: Uterus  Measurements: 7.7 x 2.1 x 3.8 cm. No fibroids or other mass visualized.  Endometrium  Thickness: 2 mm.  No focal abnormality visualized.  Right ovary  Measurements: 10.9 x 7.7 x 11.5 cm. The patient's known teratoma appears to arise from the right ovary. The teratoma measures 8.3 x 6.2 x 8.9 cm.  Left ovary  Measurements: 3.3 x 2.2 x 3.1 cm. Normal appearance/no adnexal mass.  Pulsed Doppler evaluation demonstrates normal low-resistance arterial and venous waveforms in both ovaries.  Other: No other abnormalities.  IMPRESSION: 1. The patient has known teratoma appears to arise from the right ovary. Arterial and venous blood flow is seen within the ovarian tissue. 2. No other abnormalities.  OfcUS TV and Abd [place] completed.  Uterus; small; no fibroids noted. Thin endometrial strips. <67mm  Left OV 5.53ZS' 4 small follicles noted Right OV <2.0cm No cysts identified.  Normal pelvic US   Assessment & Plan:  Right pelvic pain. H/o dermoid cyst.   Normal exam  Small follicles noted.  rec f/u in 1 year or sooner prn  Tyneisha Hegeman L. Harraway-Smith, M.D., [facility]

## 2019-05-04 IMAGING — US US PELVIS COMPLETE
1 series · 14 of 25 positions shown · non-contrast
Comparison: None.

CLINICAL DATA: Known teratoma in the right pelvis. Evaluate for
torsion. Pain.

EXAM:
TRANSABDOMINAL ULTRASOUND OF PELVIS
DOPPLER ULTRASOUND OF OVARIES
TECHNIQUE: Transabdominal ultrasound examination of the pelvis was performed
including evaluation of the uterus, ovaries, adnexal regions, and
pelvic cul-de-sac.
Color and duplex Doppler ultrasound was utilized to evaluate blood
flow to the ovaries.

[Series 1: us pelvis complete · 0.24mm/px · 14 of 59 slices shown]
[im 1/59]
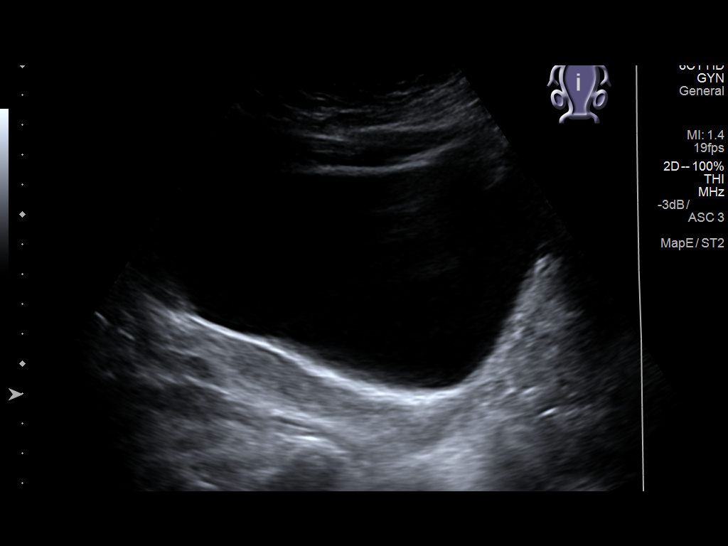
[im 5/59]
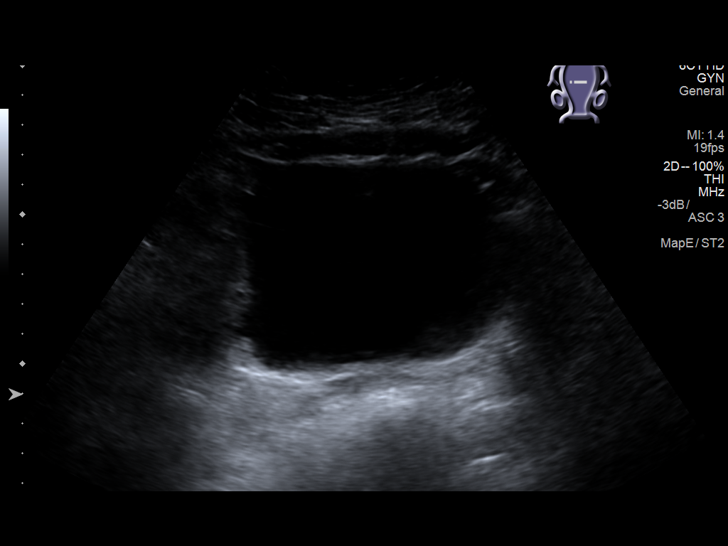
[im 10/59]
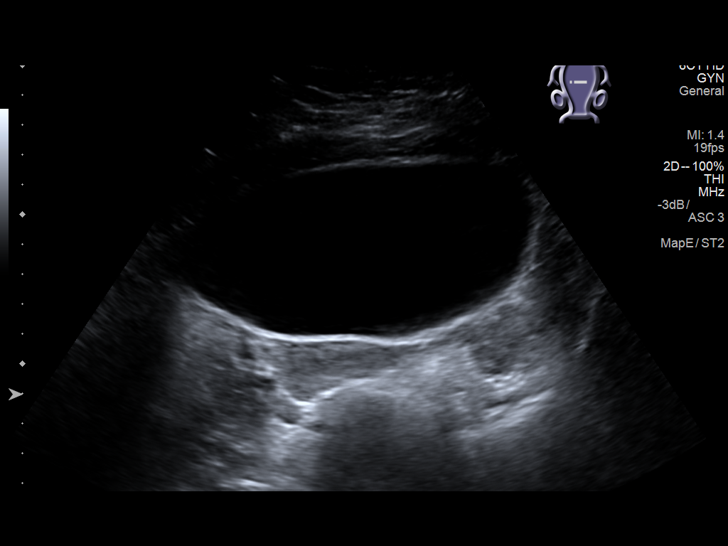
[im 15/59]
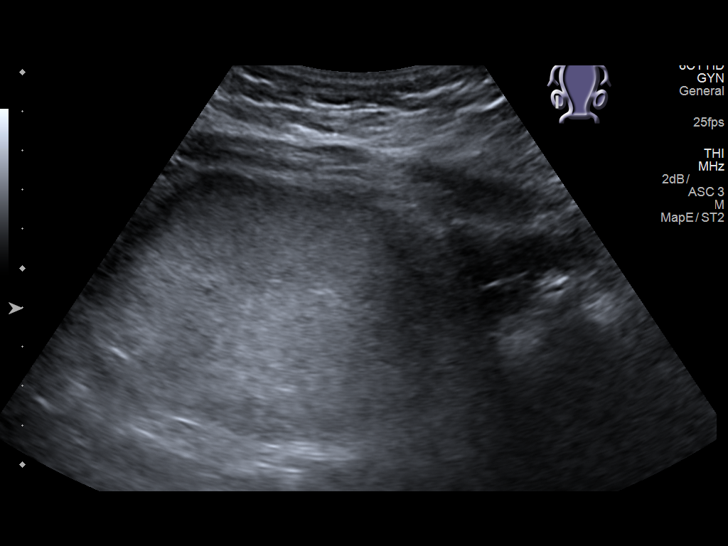
[im 20/59]
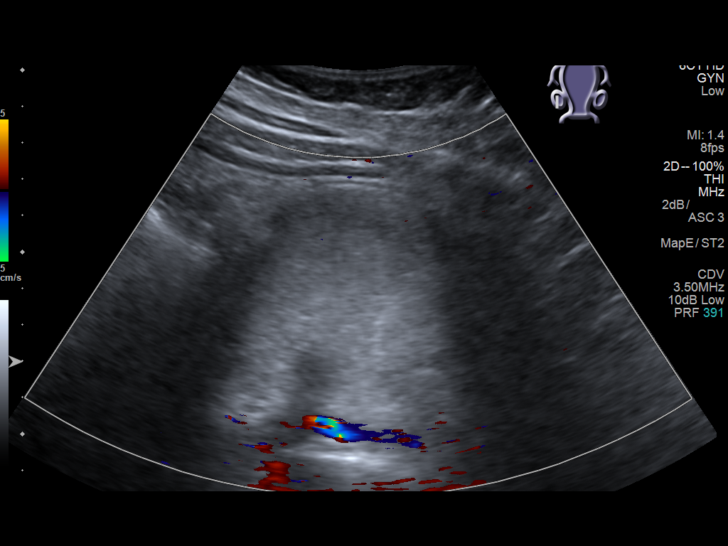
[im 22/59]
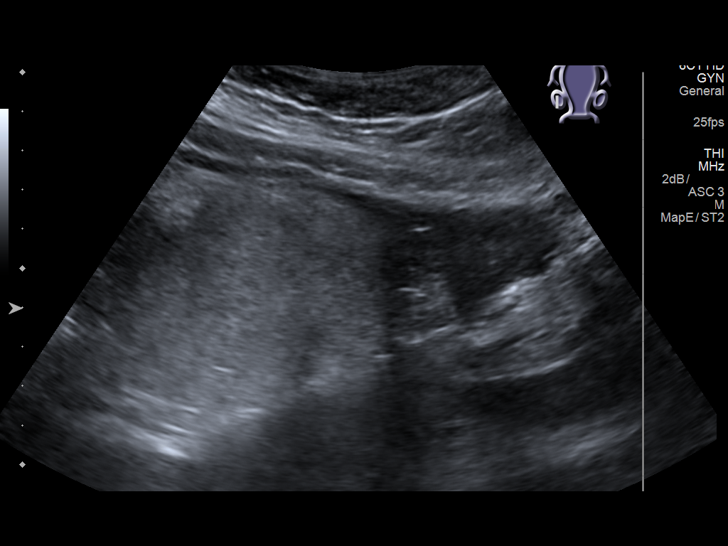
[im 27/59]
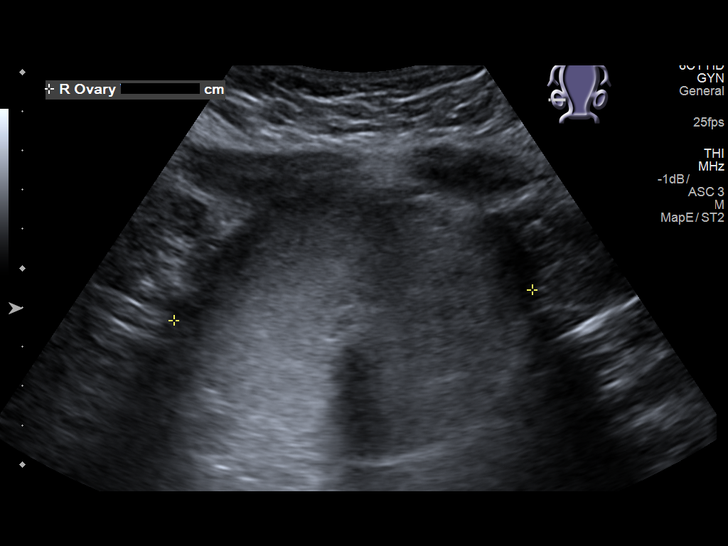
[im 32/59]
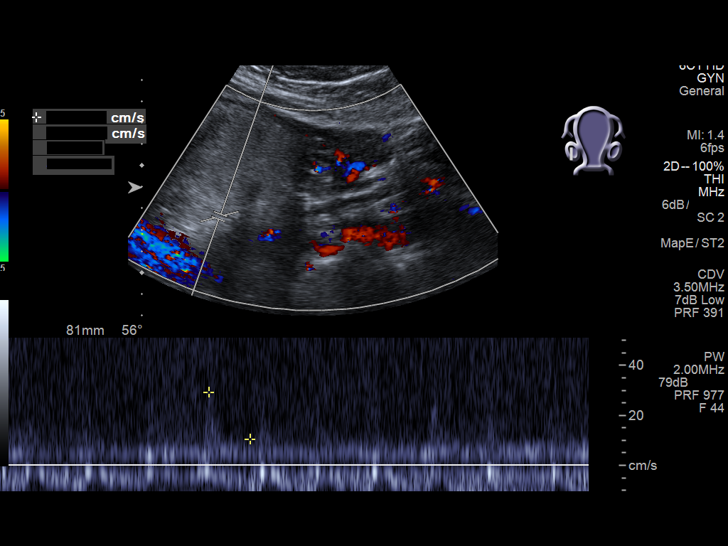
[im 37/59]
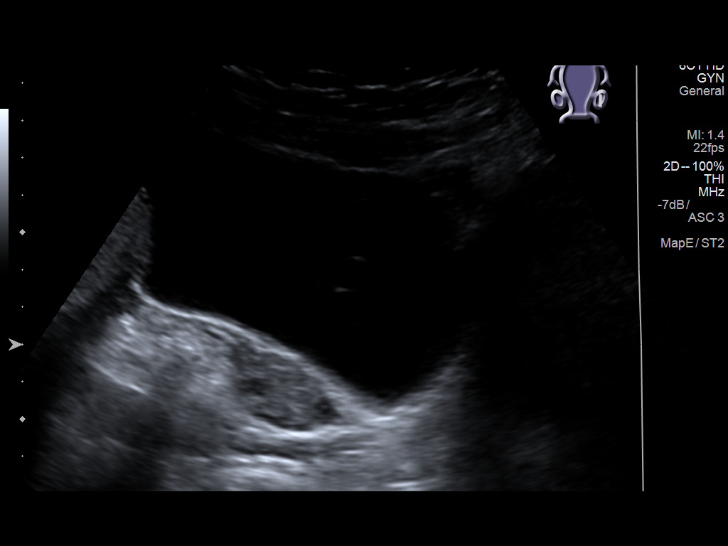
[im 39/59]
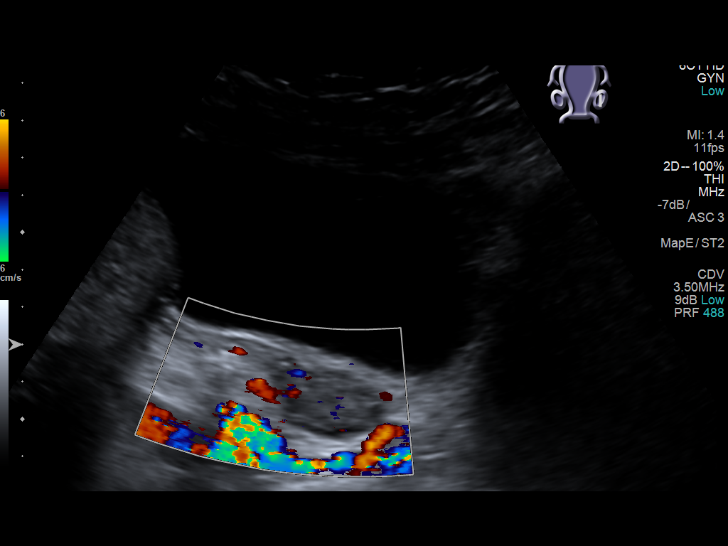
[im 44/59]
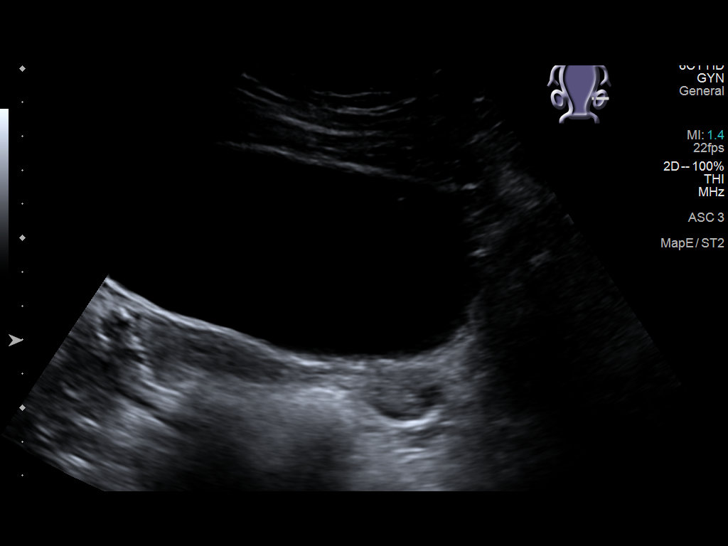
[im 49/59]
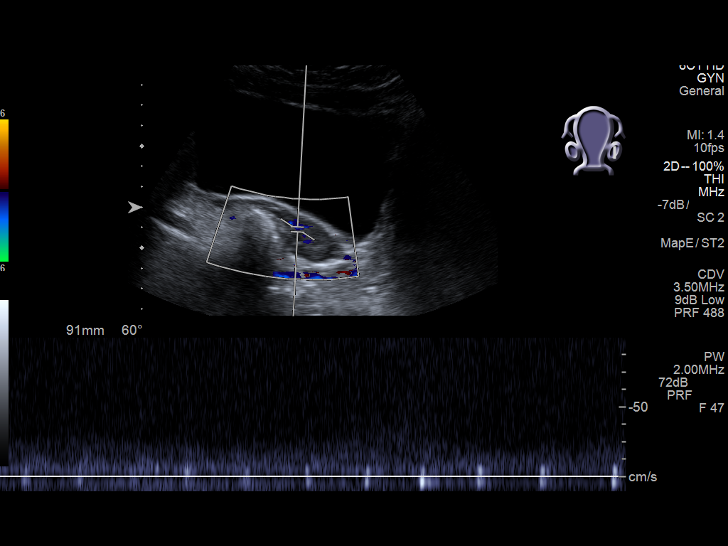
[im 54/59]
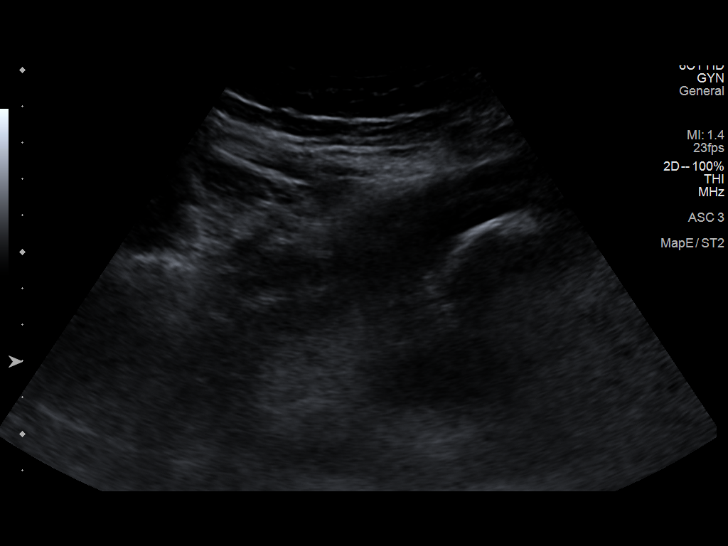
[im 59/59]
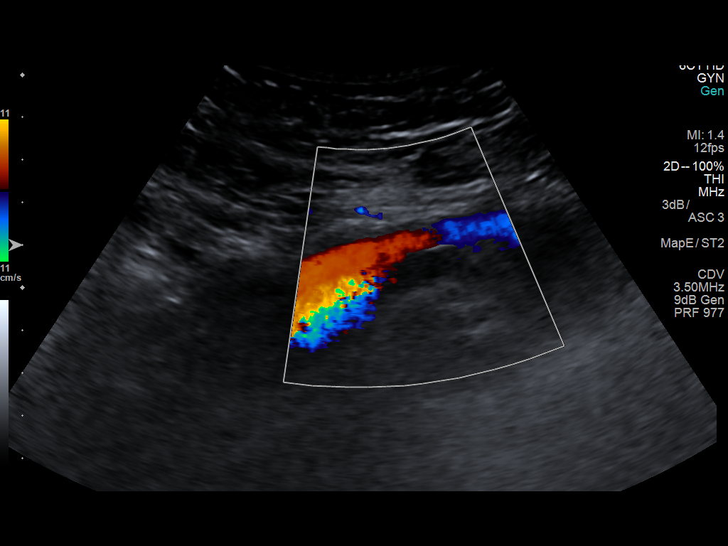

[14 of 25 positions shown; findings below may reference images not displayed]

FINDINGS: Uterus

Measurements: 7.7 x 2.1 x 3.8 cm. No fibroids or other mass
visualized.

Endometrium

Thickness: 2 mm.  No focal abnormality visualized.

Right ovary

Measurements: 10.9 x 7.7 x 11.5 cm. The patient's known teratoma
appears to arise from the right ovary. The teratoma measures 8.3 x
6.2 x 8.9 cm.

Left ovary

Measurements: 3.3 x 2.2 x 3.1 cm. Normal appearance/no adnexal mass.

Pulsed Doppler evaluation demonstrates normal low-resistance
arterial and venous waveforms in both ovaries.

Other: No other abnormalities.
IMPRESSION: 1. The patient has known teratoma appears to arise from the right
ovary. Arterial and venous blood flow is seen within the ovarian
tissue.
2. No other abnormalities.

## 2019-07-22 ENCOUNTER — Other Ambulatory Visit (HOSPITAL_COMMUNITY)
Admission: RE | Admit: 2019-07-22 | Discharge: 2019-07-22 | Disposition: A | Discharge status: Home / Self Care | Payer: Medicaid Other | Source: Ambulatory Visit | Attending: Family Medicine | Admitting: Family Medicine

## 2019-07-22 ENCOUNTER — Encounter: Payer: Self-pay | Admitting: Obstetrics & Gynecology

## 2019-07-22 ENCOUNTER — Other Ambulatory Visit: Payer: Self-pay

## 2019-07-22 ENCOUNTER — Ambulatory Visit (INDEPENDENT_AMBULATORY_CARE_PROVIDER_SITE_OTHER): Payer: Medicaid Other | Admitting: Obstetrics & Gynecology

## 2019-07-22 VITALS — BP 116/67 | HR 57 | Ht 62.0 in | Wt 128.0 lb

## 2019-07-22 DIAGNOSIS — Z3009 Encounter for other general counseling and advice on contraception: Secondary | ICD-10-CM

## 2019-07-22 DIAGNOSIS — Z01419 Encounter for gynecological examination (general) (routine) without abnormal findings: Secondary | ICD-10-CM | POA: Insufficient documentation

## 2019-07-22 DIAGNOSIS — Z30430 Encounter for insertion of intrauterine contraceptive device: Secondary | ICD-10-CM | POA: Diagnosis not present

## 2019-07-22 DIAGNOSIS — Z0000 Encounter for general adult medical examination without abnormal findings: Secondary | ICD-10-CM | POA: Diagnosis not present

## 2019-07-22 DIAGNOSIS — Z3202 Encounter for pregnancy test, result negative: Secondary | ICD-10-CM

## 2019-07-22 LAB — POCT URINE PREGNANCY: Preg Test, Ur: NEGATIVE

## 2019-07-22 MED ORDER — LEVONORGESTREL 19.5 MCG/DAY IU IUD
INTRAUTERINE_SYSTEM | Freq: Once | INTRAUTERINE | Status: AC
  Administered 2019-07-22: 1 via INTRAUTERINE

## 2019-07-22 NOTE — Addendum Note (Signed)
Addended by: Valentina Lucks on: 07/22/2019 03:21 PM   Modules accepted: Orders

## 2019-07-22 NOTE — Addendum Note (Signed)
Addended by: Wendelyn Breslow L on: 07/22/2019 11:09 AM   Modules accepted: Orders

## 2019-07-22 NOTE — Patient Instructions (Signed)
Intrauterine Device Insertion, Care After  This sheet gives you information about how to care for yourself after your procedure. Your health care provider may also give you more specific instructions. If you have problems or questions, contact your health care provider. What can I expect after the procedure? After the procedure, it is common to have:  Cramps and pain in the abdomen.  Light bleeding (spotting) or heavier bleeding that is like your menstrual period. This may last for up to a few days.  Lower back pain.  Dizziness.  Headaches.  Nausea. Follow these instructions at home:  Before resuming sexual activity, check to make sure that you can feel the IUD string(s). You should be able to feel the end of the string(s) below the opening of your cervix. If your IUD string is in place, you may resume sexual activity. ? If you had a hormonal IUD inserted more than 7 days after your most recent period started, you will need to use a backup method of birth control for 7 days after IUD insertion. Ask your health care provider whether this applies to you.  Continue to check that the IUD is still in place by feeling for the string(s) after every menstrual period, or once a month.  Take over-the-counter and prescription medicines only as told by your health care provider.  Do not drive or use heavy machinery while taking prescription pain medicine.  Keep all follow-up visits as told by your health care provider. This is important. Contact a health care provider if:  You have bleeding that is heavier or lasts longer than a normal menstrual cycle.  You have a fever.  You have cramps or abdominal pain that get worse or do not get better with medicine.  You develop abdominal pain that is new or is not in the same area of earlier cramping and pain.  You feel lightheaded or weak.  You have abnormal or bad-smelling discharge from your vagina.  You have pain during sexual activity.   You have any of the following problems with your IUD string(s): ? The string bothers or hurts you or your sexual partner. ? You cannot feel the string. ? The string has gotten longer.  You can feel the IUD in your vagina.  You think you may be pregnant, or you miss your menstrual period.  You think you may have an STI (sexually transmitted infection). Get help right away if:  You have flu-like symptoms.  You have a fever and chills.  You can feel that your IUD has slipped out of place. Summary  After the procedure, it is common to have cramps and pain in the abdomen. It is also common to have light bleeding (spotting) or heavier bleeding that is like your menstrual period.  Continue to check that the IUD is still in place by feeling for the string(s) after every menstrual period, or once a month.  Keep all follow-up visits as told by your health care provider. This is important.  Contact your health care provider if you have problems with your IUD string(s), such as the string getting longer or bothering you or your sexual partner. This information is not intended to replace advice given to you by your health care provider. Make sure you discuss any questions you have with your health care provider. Document Released: 03/27/2011 Document Revised: 07/11/2017 Document Reviewed: 06/19/2016 Elsevier Patient Education  2020 [employer]. Levonorgestrel intrauterine device (IUD) What is this medicine? LEVONORGESTREL IUD (LEE voe nor jes  trel) is a contraceptive (birth control) device. The device is placed inside the uterus by a healthcare professional. It is used to prevent pregnancy. This device can also be used to treat heavy bleeding that occurs during your period. This medicine may be used for other purposes; ask your health care provider or pharmacist if you have questions. COMMON BRAND NAME(S): Minette Headland What should I tell my health care provider before I take  this medicine? They need to know if you have any of these conditions:  abnormal Pap smear  cancer of the breast, uterus, or cervix  diabetes  endometritis  genital or pelvic infection now or in the past  have more than one sexual partner or your partner has more than one partner  heart disease  history of an ectopic or tubal pregnancy  immune system problems  IUD in place  liver disease or tumor  problems with blood clots or take blood-thinners  seizures  use intravenous drugs  uterus of unusual shape  vaginal bleeding that has not been explained  an unusual or allergic reaction to levonorgestrel, other hormones, silicone, or polyethylene, medicines, foods, dyes, or preservatives  pregnant or trying to get pregnant  breast-feeding How should I use this medicine? This device is placed inside the uterus by a health care professional. Talk to your pediatrician regarding the use of this medicine in children. Special care may be needed. Overdosage: If you think you have taken too much of this medicine contact a poison control center or emergency room at once. NOTE: This medicine is only for you. Do not share this medicine with others. What if I miss a dose? This does not apply. Depending on the brand of device you have inserted, the device will need to be replaced every 3 to 6 years if you wish to continue using this type of birth control. What may interact with this medicine? Do not take this medicine with any of the following medications:  amprenavir  bosentan  fosamprenavir This medicine may also interact with the following medications:  aprepitant  armodafinil  barbiturate medicines for inducing sleep or treating seizures  bexarotene  boceprevir  griseofulvin  medicines to treat seizures like carbamazepine, ethotoin, felbamate, oxcarbazepine, phenytoin, topiramate  modafinil  pioglitazone  rifabutin  rifampin  rifapentine  some medicines  to treat HIV infection like atazanavir, efavirenz, indinavir, lopinavir, nelfinavir, tipranavir, ritonavir  St. John's wort  warfarin This list may not describe all possible interactions. Give your health care provider a list of all the medicines, herbs, non-prescription drugs, or dietary supplements you use. Also tell them if you smoke, drink alcohol, or use illegal drugs. Some items may interact with your medicine. What should I watch for while using this medicine? Visit your doctor or health care professional for regular check ups. See your doctor if you or your partner has sexual contact with others, becomes HIV positive, or gets a sexual transmitted disease. This product does not protect you against HIV infection (AIDS) or other sexually transmitted diseases. You can check the placement of the IUD yourself by reaching up to the top of your vagina with clean fingers to feel the threads. Do not pull on the threads. It is a good habit to check placement after each menstrual period. Call your doctor right away if you feel more of the IUD than just the threads or if you cannot feel the threads at all. The IUD may come out by itself. You may become pregnant if  the device comes out. If you notice that the IUD has come out use a backup birth control method like condoms and call your health care provider. Using tampons will not change the position of the IUD and are okay to use during your period. This IUD can be safely scanned with magnetic resonance imaging (MRI) only under specific conditions. Before you have an MRI, tell your healthcare provider that you have an IUD in place, and which type of IUD you have in place. What side effects may I notice from receiving this medicine? Side effects that you should report to your doctor or health care professional as soon as possible:  allergic reactions like skin rash, itching or hives, swelling of the face, lips, or tongue  fever, flu-like symptoms   genital sores  high blood pressure  no menstrual period for 6 weeks during use  pain, swelling, warmth in the leg  pelvic pain or tenderness  severe or sudden headache  signs of pregnancy  stomach cramping  sudden shortness of breath  trouble with balance, talking, or walking  unusual vaginal bleeding, discharge  yellowing of the eyes or skin Side effects that usually do not require medical attention (report to your doctor or health care professional if they continue or are bothersome):  acne  breast pain  change in sex drive or performance  changes in weight  cramping, dizziness, or faintness while the device is being inserted  headache  irregular menstrual bleeding within first 3 to 6 months of use  nausea This list may not describe all possible side effects. Call your doctor for medical advice about side effects. You may report side effects to FDA at 1-800-FDA-1088. Where should I keep my medicine? This does not apply. NOTE: This sheet is a summary. It may not cover all possible information. If you have questions about this medicine, talk to your doctor, pharmacist, or health care provider.  2020 Elsevier/Gold Standard (2018-06-09 13:22:01)

## 2019-07-22 NOTE — Progress Notes (Signed)
Subjective:     Beth Vaughn is a 18 y.o. female here for a routine exam. G0 Pt reports some mild pain on her right side. She is concerned about the cyst recurring. The pain is mild. Pt is sexually active with no contraception. Her LMP was mid NOV. She has not been sexually active since her LMP.      Gynecologic History Patient's last menstrual period was 06/28/2019. Contraception: none Last Pap: n/a.  Last mammogram: n/a  Obstetric History OB History  Gravida Para Term Preterm AB Living  0 0 0 0 0 0  SAB TAB Ectopic Multiple Live Births  0 0 0 0 0   The following portions of the patient's history were reviewed and updated as appropriate: allergies, current medications, past family history, past medical history, past social history, past surgical history and problem list.  Review of Systems Pertinent items are noted in HPI.    Objective:  BP 116/67   Pulse (!) 57   Ht 5\' 2"  (1.575 m)   Wt 128 lb (58.1 kg)   LMP 06/28/2019   BMI 23.41 kg/m   General Appearance:    Alert, cooperative, no distress, appears stated age  Head:    Normocephalic, without obvious abnormality, atraumatic  Eyes:    conjunctiva/corneas clear, EOM's intact, both eyes  Ears:    Normal external ear canals, both ears  Nose:   Nares normal, septum midline, mucosa normal, no drainage    or sinus tenderness  Throat:   Lips, mucosa, and tongue normal; teeth and gums normal  Neck:   Supple, symmetrical, trachea midline, no adenopathy;    thyroid:  no enlargement/tenderness/nodules  Back:     Symmetric, no curvature, ROM normal, no CVA tenderness  Lungs:     respirations unlabored  Chest Wall:    No tenderness or deformity   Heart:    Regular rate and rhythm  Breast Exam:    No tenderness, masses, or nipple abnormality  Abdomen:     Soft, non-tender, bowel sounds active all four quadrants,    no masses, no organomegaly  Genitalia:    Normal female without lesion, discharge or tenderness     Extremities:    Extremities normal, atraumatic, no cyanosis or edema  Pulses:   2+ and symmetric all extremities  Skin:   Skin color, texture, turgor normal, no rashes or lesions   IUD Insertion Procedure Note Patient identified, informed consent performed.  Discussed risks of irregular bleeding, cramping, infection, malpositioning or misplacement of the IUD outside the uterus which may require further procedures. Time out was performed.  Urine pregnancy test negative.  Speculum placed in the vagina.  Cervix visualized.  Cleaned with Betadine x 2.  Grasped anteriorly with a single tooth tenaculum.  Uterus sounded to 7 cm.  Liletta IUD placed per manufacturer's recommendations.  Strings trimmed to 3 cm. Tenaculum was removed, good hemostasis noted.  Patient tolerated procedure well.    Assessment:    Healthy female exam.   Contraception counseling. Reviewed all forms of birth control options available including abstinence; over the counter/barrier methods; hormonal contraceptive medication including pill, patch, ring, injection,contraceptive implant; hormonal and nonhormonal IUDs.  Risks and benefits reviewed.  Questions were answered.  Information was given to patient to review.   IUD insertion   Plan:   F/u cervical cx Patient was given post-procedure instructions.  Patient was asked to follow up in 4 weeks for IUD check.  Celestial Barnfield L. Harraway-Smith, M.D., Cherlynn June

## 2019-07-23 LAB — CERVICOVAGINAL ANCILLARY ONLY
Chlamydia: NEGATIVE
Comment: NEGATIVE
Comment: NORMAL
Neisseria Gonorrhea: NEGATIVE

## 2019-08-19 ENCOUNTER — Ambulatory Visit: Payer: Medicaid Other | Admitting: Family Medicine

## 2019-09-18 IMAGING — CT CT ABD-PELV W/ CM
2 of 4 series · 16 of 46 positions shown, 18 images · IV contrast (Omni 300)
Comparison: None

CLINICAL DATA: Right lower quadrant pain.  Nausea.

EXAM:
CT ABDOMEN AND PELVIS WITH CONTRAST
TECHNIQUE: Multidetector CT imaging of the abdomen and pelvis was performed
using the standard protocol following bolus administration of
intravenous contrast.
CONTRAST:  100mL TPOTJ9-KNN IOPAMIDOL (TPOTJ9-KNN) INJECTION 61%

[Series 3: a/p w/ 5mm · axial · 0.73mm/px · z∈[+789,+1204]mm · 13 of 91 slices shown, 15 images]
[im 4/91  soft-tissue]
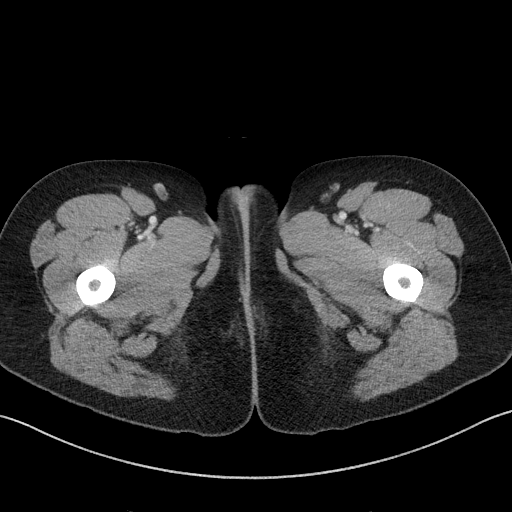
[im 4/91  bone]
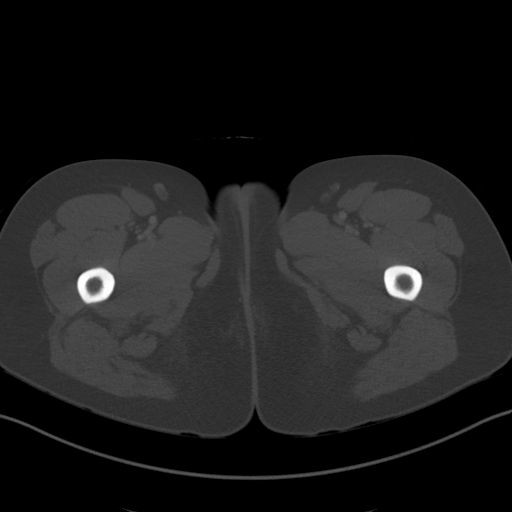
[im 11/91  soft-tissue]
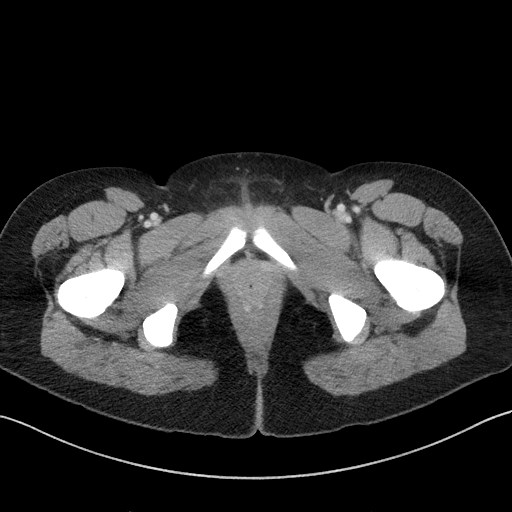
[im 19/91  soft-tissue]
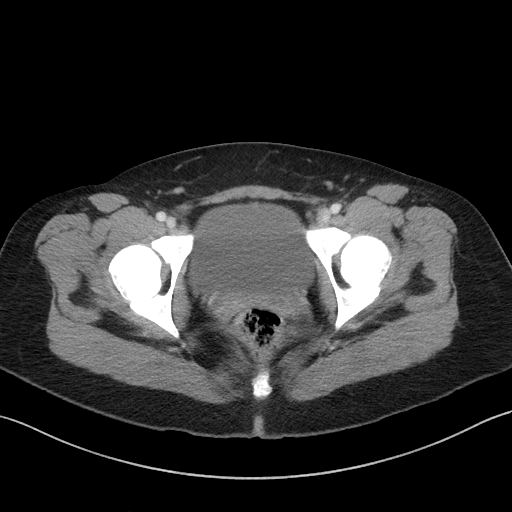
[im 26/91  soft-tissue]
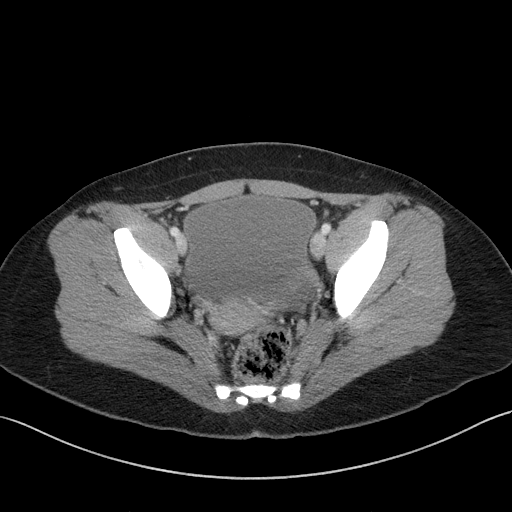
[im 33/91  soft-tissue]
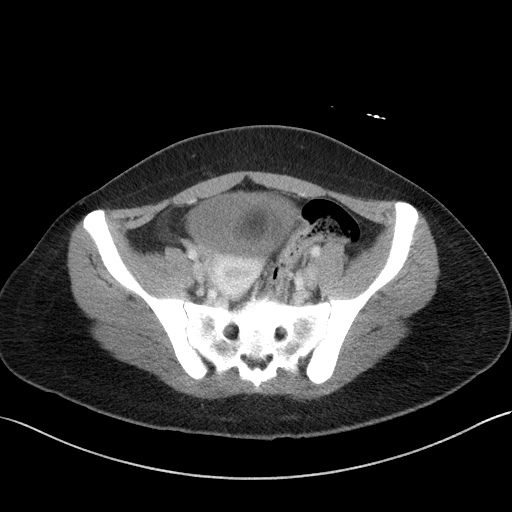
[im 40/91  soft-tissue]
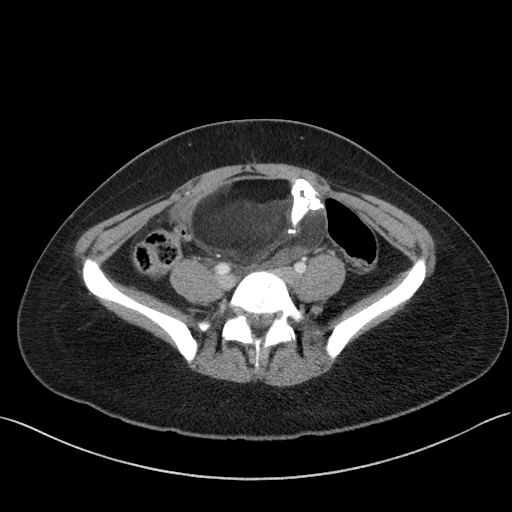
[im 47/91  soft-tissue]
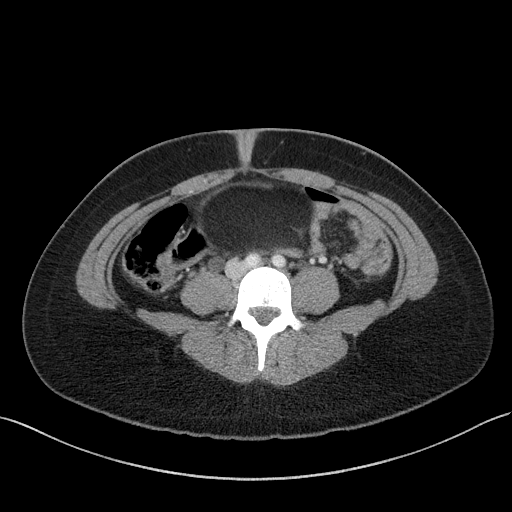
[im 51/91  soft-tissue]
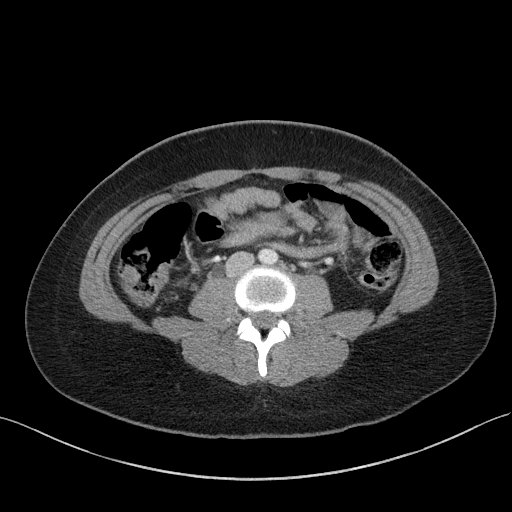
[im 58/91  soft-tissue]
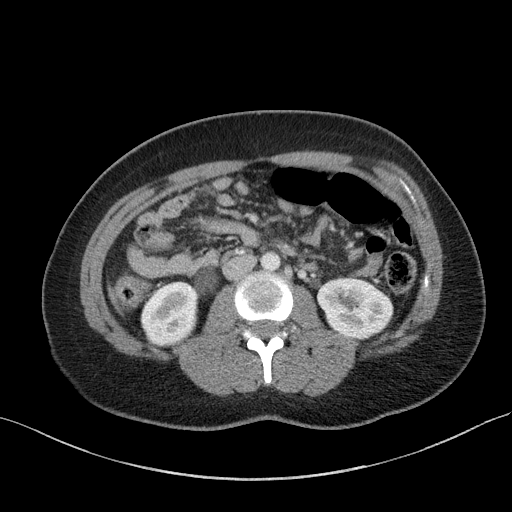
[im 58/91  bone]
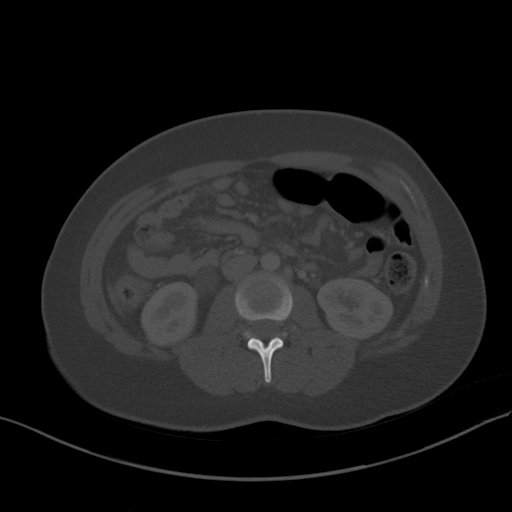
[im 65/91  soft-tissue]
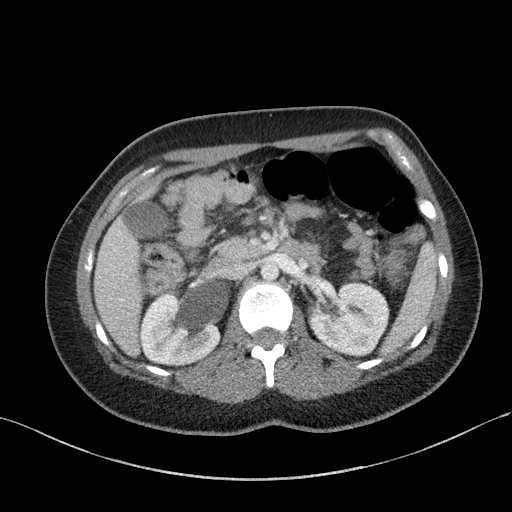
[im 73/91  soft-tissue]
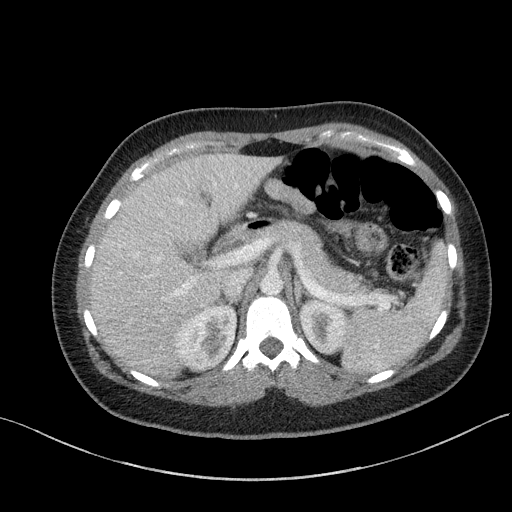
[im 80/91  soft-tissue]
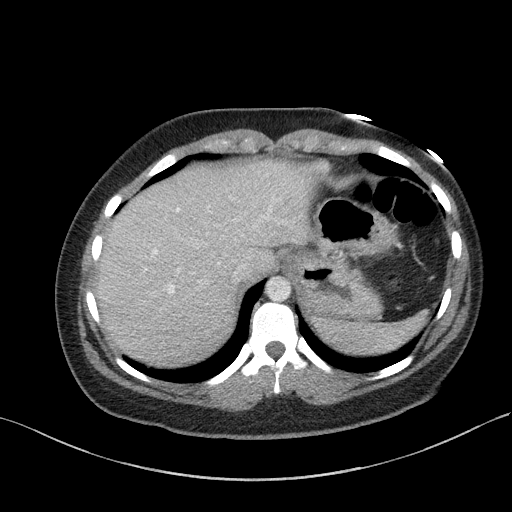
[im 87/91  soft-tissue]
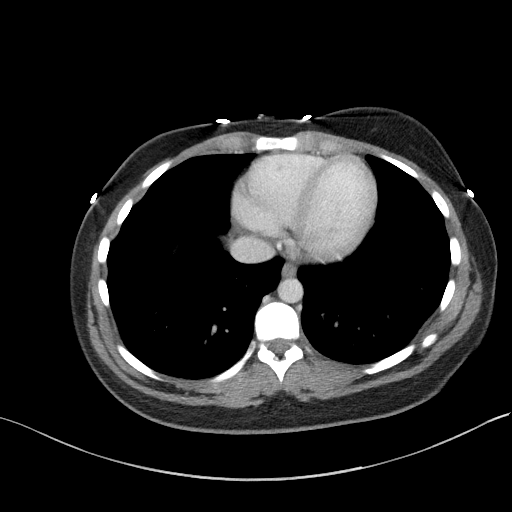

[Series 6: a/p w/ cor · coronal · 0.73mm/px · 3 of 151 slices shown]
[im 51/151  soft-tissue]
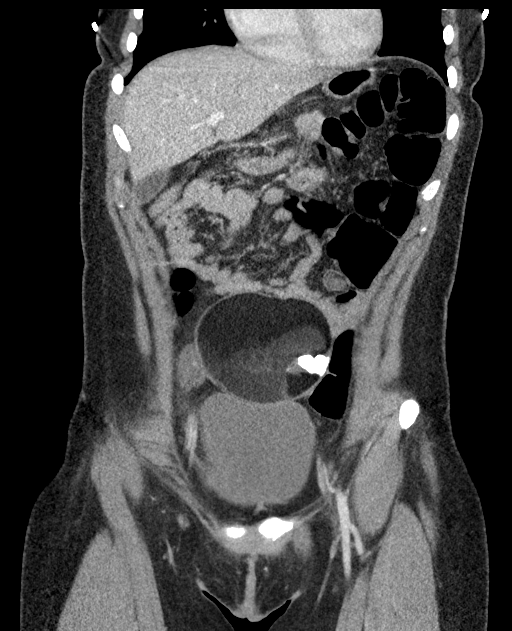
[im 67/151  soft-tissue]
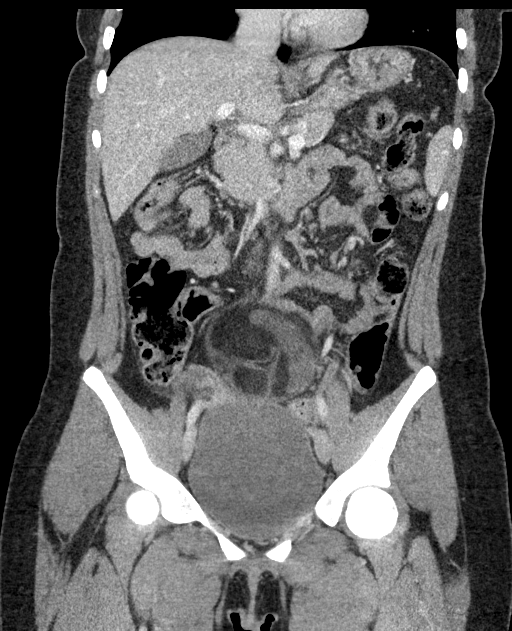
[im 84/151  soft-tissue]
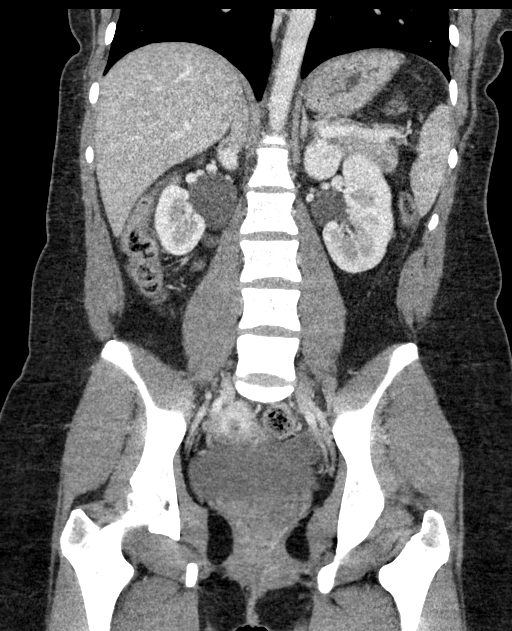

[16 of 46 positions shown; findings below may reference images not displayed]

FINDINGS: Lower chest: No acute abnormality.

Hepatobiliary: No focal liver abnormality is seen. No gallstones,
gallbladder wall thickening, or biliary dilatation.

Pancreas: Unremarkable. No pancreatic ductal dilatation or
surrounding inflammatory changes.

Spleen: Normal in size without focal abnormality.

Adrenals/Urinary Tract: Adrenal glands are unremarkable. Kidneys are
normal, without renal calculi, focal lesion, or hydronephrosis.
Bilateral pelvocaliectasis is identified. Urinary bladder appears
normal.

Stomach/Bowel: Stomach is within normal limits. Appendix appears
normal. No evidence of bowel wall thickening, distention, or
inflammatory changes.

Vascular/Lymphatic: No significant vascular findings are present. No
enlarged abdominal or pelvic lymph nodes.

Reproductive: Uterus appears normal. There is a large teratoma
identified within the lower abdomen and pelvis measuring 9.8 x
by 8.5 cm (volume = 290 cm^3), image 52/3. This appears to be
arising from the right ovary, image 55/3. There is associated mass
effect upon both ureters likely accounting for bilateral
pelvocaliectasis.

Other: No free fluid or fluid collections identified.

Musculoskeletal: No acute or significant osseous findings.
IMPRESSION: 1. Large right ovarian teratoma with a volume of approximately 290
cc is identified. This has mass effect upon both ureters. The size
of this mass may predispose the patient to ovarian torsion. If there
is a clinical concern for ovarian torsion pelvic sonogram with
ovarian Doppler may be helpful. Otherwise, gynecologic consultation
is advised.
2. No evidence for acute appendicitis.

## 2021-05-31 ENCOUNTER — Emergency Department (HOSPITAL_BASED_OUTPATIENT_CLINIC_OR_DEPARTMENT_OTHER)
Admission: EM | Admit: 2021-05-31 | Discharge: 2021-05-31 | Disposition: A | Discharge status: Home / Self Care | Payer: Medicaid Other | Attending: Emergency Medicine | Admitting: Emergency Medicine

## 2021-05-31 ENCOUNTER — Encounter (HOSPITAL_BASED_OUTPATIENT_CLINIC_OR_DEPARTMENT_OTHER): Payer: Self-pay

## 2021-05-31 ENCOUNTER — Other Ambulatory Visit: Payer: Self-pay

## 2021-05-31 DIAGNOSIS — R000 Tachycardia, unspecified: Secondary | ICD-10-CM | POA: Insufficient documentation

## 2021-05-31 DIAGNOSIS — R0789 Other chest pain: Secondary | ICD-10-CM | POA: Diagnosis not present

## 2021-05-31 DIAGNOSIS — R55 Syncope and collapse: Secondary | ICD-10-CM | POA: Insufficient documentation

## 2021-05-31 LAB — CBC WITH DIFFERENTIAL/PLATELET
Abs Immature Granulocytes: 0.05 10*3/uL (ref 0.00–0.07)
Basophils Absolute: 0 10*3/uL (ref 0.0–0.1)
Basophils Relative: 0 %
Eosinophils Absolute: 0 10*3/uL (ref 0.0–0.5)
Eosinophils Relative: 0 %
HCT: 38.1 % (ref 36.0–46.0)
Hemoglobin: 13.3 g/dL (ref 12.0–15.0)
Immature Granulocytes: 0 %
Lymphocytes Relative: 6 %
Lymphs Abs: 0.7 10*3/uL (ref 0.7–4.0)
MCH: 31.8 pg (ref 26.0–34.0)
MCHC: 34.9 g/dL (ref 30.0–36.0)
MCV: 91.1 fL (ref 80.0–100.0)
Monocytes Absolute: 0.5 10*3/uL (ref 0.1–1.0)
Monocytes Relative: 5 %
Neutro Abs: 10.1 10*3/uL — ABNORMAL HIGH (ref 1.7–7.7)
Neutrophils Relative %: 89 %
Platelets: 168 10*3/uL (ref 150–400)
RBC: 4.18 MIL/uL (ref 3.87–5.11)
RDW: 11.7 % (ref 11.5–15.5)
WBC: 11.5 10*3/uL — ABNORMAL HIGH (ref 4.0–10.5)
nRBC: 0 % (ref 0.0–0.2)

## 2021-05-31 LAB — COMPREHENSIVE METABOLIC PANEL
ALT: 101 U/L — ABNORMAL HIGH (ref 0–44)
AST: 54 U/L — ABNORMAL HIGH (ref 15–41)
Albumin: 4.3 g/dL (ref 3.5–5.0)
Alkaline Phosphatase: 54 U/L (ref 38–126)
Anion gap: 10 (ref 5–15)
BUN: 10 mg/dL (ref 6–20)
CO2: 25 mmol/L (ref 22–32)
Calcium: 9 mg/dL (ref 8.9–10.3)
Chloride: 101 mmol/L (ref 98–111)
Creatinine, Ser: 0.55 mg/dL (ref 0.44–1.00)
GFR, Estimated: >60 mL/min (ref >60)
Glucose, Bld: 107 mg/dL — ABNORMAL HIGH (ref 70–99)
Potassium: 3.7 mmol/L (ref 3.5–5.1)
Sodium: 136 mmol/L (ref 135–145)
Total Bilirubin: 2.7 mg/dL — ABNORMAL HIGH (ref 0.3–1.2)
Total Protein: 7.4 g/dL (ref 6.5–8.1)

## 2021-05-31 LAB — D-DIMER, QUANTITATIVE: D-Dimer, Quant: 0.33 ug/mL-FEU (ref 0.00–0.50)

## 2021-05-31 LAB — TROPONIN I (HIGH SENSITIVITY): Troponin I (High Sensitivity): <2 ng/L (ref <18)

## 2021-05-31 LAB — PREGNANCY, URINE: Preg Test, Ur: NEGATIVE

## 2021-05-31 MED ORDER — SODIUM CHLORIDE 0.9 % IV BOLUS
1000.0000 mL | Freq: Once | INTRAVENOUS | Status: AC
  Administered 2021-05-31: 1000 mL via INTRAVENOUS

## 2021-05-31 NOTE — ED Notes (Signed)
Pt NAD in bed, a/ox4. Pt states she passed out x 2 today while painting. Witnessed by painters, lowered to floor. Pt states this happened before when she was a child. Per bystanders, pt "locked up", and pt states she had incontinence. Denies oral trauma or seizure HX. Pt denies any post-ictal state. Pt also reports chest tightness currently, 2/10.

## 2021-05-31 NOTE — ED Notes (Signed)
No acute distress noted upon this RN's departure of patient. Verified discharge paperwork with name and DOB. Vital signs stable. Patient taken to checkout window. Discharge paperwork discussed with patient. No further questions voiced upon discharge.

## 2021-05-31 NOTE — ED Provider Notes (Signed)
[place] EMERGENCY DEPARTMENT Provider Note   CSN: [id number] Arrival date & time: 05/31/21  1623     History Chief Complaint  Patient presents with   Loss of Consciousness   Chest Pain    Beth Vaughn is a 20 y.o. female.  HPI Patient is a 20 year old female who presents to the emergency department due to multiple syncopal episodes that occurred earlier today.  Patient states that this afternoon she was at a store paying for items when she became lightheaded and felt as if her mouth was watering.  Bystanders at the scene were able to place a chair under her and she was brought down to the chair where she briefly lost consciousness.  She states that she experienced urinary incontinence during this episode.  She is unsure of any tonic-clonic activity.  Shortly thereafter she states she stood up and was standing and experienced the same symptoms and was brought back down to a seated position and lost consciousness once again.  She states that after regaining consciousness she had an episode of nausea/vomiting.  Denies any persistent nausea/vomiting.  Patient reports mild central chest heaviness.  No shortness of breath.  She states that she woke up around 5 AM today with body aches and a mild sore throat and she took a dose of XL-3 cold and cough.  She does note that she took this medication in the past and experienced similar episodes of lightheadedness but never syncopized.  She reports a history of recurrent syncopal episodes when she was a child.  She states she has never been diagnosed with seizures.    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   Left ankle injury, initial encounter 10/09/2016    Past Surgical History:  Procedure Laterality Date   LAPAROSCOPIC OVARIAN CYSTECTOMY Right 01/08/2018   Procedure: LAPAROSCOPIC OVARIAN CYSTECTOMY;  Surgeon: Osborne Oman, MD;  Location: [facility];  Service: Gynecology;  Laterality: Right;   WISDOM TOOTH EXTRACTION       OB History     Gravida  0   Para  0   Term  0   Preterm  0   AB  0   Living  0      SAB  0   IAB  0   Ectopic  0   Multiple  0   Live Births  0           Family History  Problem Relation Age of Onset   Hypertension Mother    Cancer Neg Hx    Diabetes Neg Hx     Social History   Tobacco Use   Smoking status: Never   Smokeless tobacco: Never  Vaping Use   Vaping Use: Never used  Substance Use Topics   Alcohol use: Never   Drug use: Never    Home Medications Prior to Admission medications   Medication Sig Start Date End Date Taking? Authorizing Provider  acetaminophen (TYLENOL) 500 MG tablet Take 500 mg by mouth every 6 (six) hours as needed for moderate pain or headache.    [provider]  docusate sodium (COLACE) 100 MG capsule Take 1 capsule (100 mg total) by mouth 2 (two) times daily as needed for mild constipation or moderate constipation. Patient not taking: Reported on 04/27/2018 01/08/18   Anyanwu, Sallyanne Havers, MD  ibuprofen (ADVIL,MOTRIN) 600  MG tablet Take 1 tablet (600 mg total) by mouth every 6 (six) hours as needed for headache, mild pain, moderate pain or cramping. Patient not taking: Reported on 04/27/2018 01/08/18   Anyanwu, Sallyanne Havers, MD  ondansetron (ZOFRAN ODT) 4 MG disintegrating tablet Take 1 tablet (4 mg total) by mouth every 6 (six) hours as needed for nausea or vomiting. Patient not taking: Reported on 04/27/2018 01/08/18   Osborne Oman, MD  oxyCODONE-acetaminophen (PERCOCET/ROXICET) 5-325 MG tablet Take 1 tablet by mouth every 4 (four) hours as needed for severe pain. Patient not taking: Reported on 04/27/2018 01/08/18   Osborne Oman, MD    Allergies    Penicillins  Review of Systems   Review of Systems  All other systems reviewed and are negative. Ten systems reviewed and are negative for acute change,  except as noted in the HPI.   Physical Exam Updated Vital Signs BP 124/77   Pulse 86   Temp 98.4 F (36.9 C) (Oral)   Resp 20   Ht 5\' 2"  (1.575 m)   Wt 53.8 kg   LMP 05/23/2021 (Approximate)   SpO2 100%   BMI 21.69 kg/m   Physical Exam Vitals and nursing note reviewed.  Constitutional:      General: She is not in acute distress.    Appearance: Normal appearance. She is well-developed and normal weight. She is not ill-appearing, toxic-appearing or diaphoretic.  HENT:     Head: Normocephalic and atraumatic.     Right Ear: External ear normal.     Left Ear: External ear normal.     Nose: Nose normal.     Mouth/Throat:     Mouth: Mucous membranes are moist.     Pharynx: Oropharynx is clear. No oropharyngeal exudate or posterior oropharyngeal erythema.  Eyes:     Extraocular Movements: Extraocular movements intact.  Cardiovascular:     Rate and Rhythm: Regular rhythm. Tachycardia present.     Pulses: Normal pulses.          Radial pulses are 2+ on the right side and 2+ on the left side.       Dorsalis pedis pulses are 2+ on the right side and 2+ on the left side.     Heart sounds: Normal heart sounds. Heart sounds not distant. No murmur heard. No systolic murmur is present.  No diastolic murmur is present.    No friction rub. No gallop. No S3 or S4 sounds.  Pulmonary:     Effort: Pulmonary effort is normal. No tachypnea, accessory muscle usage or respiratory distress.     Breath sounds: Normal breath sounds. No stridor. No decreased breath sounds, wheezing, rhonchi or rales.  Abdominal:     General: Abdomen is flat.     Tenderness: There is no abdominal tenderness.  Musculoskeletal:        General: Normal range of motion.     Cervical back: Normal range of motion and neck supple. No tenderness.     Right lower leg: No tenderness. No edema.     Left lower leg: No tenderness. No edema.  Skin:    General: Skin is warm and dry.  Neurological:     General: No focal deficit  present.     Mental Status: She is alert and oriented to person, place, and time.  Psychiatric:        Mood and Affect: Mood normal.        Behavior: Behavior normal.   ED Results / Procedures /  Treatments   Labs (all labs ordered are listed, but only abnormal results are displayed) Labs Reviewed  COMPREHENSIVE METABOLIC PANEL - Abnormal; Notable for the following components:      Result Value   Glucose, Bld 107 (*)    AST 54 (*)    ALT 101 (*)    Total Bilirubin 2.7 (*)    All other components within normal limits  CBC WITH DIFFERENTIAL/PLATELET - Abnormal; Notable for the following components:   WBC 11.5 (*)    Neutro Abs 10.1 (*)    All other components within normal limits  PREGNANCY, URINE  D-DIMER, QUANTITATIVE  TROPONIN I (HIGH SENSITIVITY)  TROPONIN I (HIGH SENSITIVITY)    EKG EKG Interpretation  Date/Time:  Thursday May 31 2021 16:41:47 EDT Ventricular Rate:  94 PR Interval:  144 QRS Duration: 88 QT Interval:  345 QTC Calculation: 432 R Axis:   74 Text Interpretation: Sinus rhythm Consider right atrial enlargement Confirmed by Davonna Belling [phone]) on 05/31/2021 6:58:47 PM  Radiology No results found.  Procedures Procedures   Medications Ordered in ED Medications  sodium chloride 0.9 % bolus 1,000 mL (1,000 mLs Intravenous New Bag/Given 05/31/21 1813)   ED Course  I have reviewed the triage vital signs and the nursing notes.  Pertinent labs & imaging results that were available during my care of the patient were reviewed by me and considered in my medical decision making (see chart for details).    MDM Rules/Calculators/A&P                          Pt is a 20 y.o. female who presents to the emergency department due to 2 witnessed syncopal episodes that occurred earlier today.  Labs: CBC with a white count of 11.5 and neutrophils of 10.1. CMP with a glucose of 107, AST of 54, ALT of 101, total bilirubin of 2.7. Troponin less than  2. D-dimer of 0.33. Pregnancy test is negative.  I, Rayna Sexton, PA-C, personally reviewed and evaluated these images and lab results as part of my medical decision-making.  Physical exam is extremely reassuring.  Neurological exam is benign.  Lab work shows a mild leukocytosis of 11.5 with neutrophils of 10.1.  Possibly reactive.  Elevation in LFTs with an AST of 54 and an ALT of 101.  Total bilirubin of 2.7.  No abdominal tenderness noted.  Patient did note some mild chest tightness upon arrival so troponin and D-dimer was obtained which were both reassuring.  ECG showing normal sinus rhythm.  Doubt DVT/PE.  Unsure the source of the patient's symptoms.  She does note a history of similar symptoms as a child and states she never received a diagnosis.  States she has never been diagnosed with seizures in the past.  Patient denies any witnessed tonic-clonic activity or postictal symptoms.  She does note taking XL-3 cold and cough this morning and also notes that when taking this in the past she has experienced lightheadedness but has never syncopized.  Although her work-up was reassuring, given her chest tightness and multiple syncopal episodes will give her a referral to cardiology for further evaluation.  We discussed return precautions in length.  Feel that she is stable for discharge at this time and she is agreeable.  Her questions were answered and she was amicable at the time of discharge.  Note: Portions of this report may have been transcribed using voice recognition software. Every effort was made to ensure accuracy;  however, inadvertent computerized transcription errors may be present.   Final Clinical Impression(s) / ED Diagnoses Final diagnoses:  Syncope, unspecified syncope type   Rx / DC Orders ED Discharge Orders     None        Rayna Sexton, PA-C 05/31/21 1924    Davonna Belling, MD 06/01/21 [phone]

## 2021-05-31 NOTE — ED Triage Notes (Signed)
Pt states she passed out x 2 today ~1pm-states she was incontinent of urine with first episode-pain to chest after event-NAD-steady gait

## 2021-05-31 NOTE — Discharge Instructions (Signed)
Below is the contact information for a local cardiologist.  Please give them a call soon as possible to schedule an appointment for reevaluation.  If you develop any new or worsening symptoms please come back to the emergency department.  It was a pleasure to meet you.

## 2022-01-02 ENCOUNTER — Ambulatory Visit: Payer: Medicaid Other | Admitting: Obstetrics & Gynecology

## 2022-01-07 ENCOUNTER — Other Ambulatory Visit: Payer: Self-pay

## 2022-01-07 ENCOUNTER — Encounter (HOSPITAL_BASED_OUTPATIENT_CLINIC_OR_DEPARTMENT_OTHER): Payer: Self-pay | Admitting: Emergency Medicine

## 2022-01-07 ENCOUNTER — Emergency Department (HOSPITAL_BASED_OUTPATIENT_CLINIC_OR_DEPARTMENT_OTHER)
Admission: EM | Admit: 2022-01-07 | Discharge: 2022-01-07 | Disposition: A | Discharge status: Home / Self Care | Payer: Medicaid Other | Attending: Emergency Medicine | Admitting: Emergency Medicine

## 2022-01-07 DIAGNOSIS — R55 Syncope and collapse: Secondary | ICD-10-CM | POA: Insufficient documentation

## 2022-01-07 LAB — CBC WITH DIFFERENTIAL/PLATELET
Abs Immature Granulocytes: 0.02 10*3/uL (ref 0.00–0.07)
Basophils Absolute: 0 10*3/uL (ref 0.0–0.1)
Basophils Relative: 1 %
Eosinophils Absolute: 0.1 10*3/uL (ref 0.0–0.5)
Eosinophils Relative: 1 %
HCT: 42 % (ref 36.0–46.0)
Hemoglobin: 14.1 g/dL (ref 12.0–15.0)
Immature Granulocytes: 0 %
Lymphocytes Relative: 11 %
Lymphs Abs: 0.9 10*3/uL (ref 0.7–4.0)
MCH: 31.5 pg (ref 26.0–34.0)
MCHC: 33.6 g/dL (ref 30.0–36.0)
MCV: 94 fL (ref 80.0–100.0)
Monocytes Absolute: 0.4 10*3/uL (ref 0.1–1.0)
Monocytes Relative: 5 %
Neutro Abs: 6.4 10*3/uL (ref 1.7–7.7)
Neutrophils Relative %: 82 %
Platelets: 147 10*3/uL — ABNORMAL LOW (ref 150–400)
RBC: 4.47 MIL/uL (ref 3.87–5.11)
RDW: 11.7 % (ref 11.5–15.5)
WBC: 7.7 10*3/uL (ref 4.0–10.5)
nRBC: 0 % (ref 0.0–0.2)

## 2022-01-07 LAB — BASIC METABOLIC PANEL
Anion gap: 8 (ref 5–15)
BUN: 12 mg/dL (ref 6–20)
CO2: 26 mmol/L (ref 22–32)
Calcium: 9.1 mg/dL (ref 8.9–10.3)
Chloride: 103 mmol/L (ref 98–111)
Creatinine, Ser: 0.67 mg/dL (ref 0.44–1.00)
GFR, Estimated: >60 mL/min (ref >60)
Glucose, Bld: 129 mg/dL — ABNORMAL HIGH (ref 70–99)
Potassium: 3.8 mmol/L (ref 3.5–5.1)
Sodium: 137 mmol/L (ref 135–145)

## 2022-01-07 LAB — CBG MONITORING, ED: Glucose-Capillary: 104 mg/dL — ABNORMAL HIGH (ref 70–99)

## 2022-01-07 LAB — PREGNANCY, URINE: Preg Test, Ur: NEGATIVE

## 2022-01-07 NOTE — ED Provider Notes (Signed)
[facility] EMERGENCY DEPARTMENT Provider Note   CSN: [id number] Arrival date & time: 01/07/22  1306     History  Chief Complaint  Patient presents with   Loss of Consciousness    Beth Vaughn is a 21 y.o. female.  21 yo F with a chief complaint of a syncopal event.  The patient was taking a shower and she got out and started walking down the hallway and suddenly felt hot all over and then collapsed to the ground.  She struck her head on the ground but has not had any confusion or vomiting she feels quite a bit better now.  She denies any chest pain or difficulty breathing.  Denies any significant vaginal bleeding denies abdominal pain.  Has been eating and drinking normally.  No new medications.  She had a similar episode and was seen in the Emergency Department at that time she had some transient shaking.   Loss of Consciousness     Home Medications Prior to Admission medications   Medication Sig Start Date End Date Taking? Authorizing Provider  acetaminophen (TYLENOL) 500 MG tablet Take 500 mg by mouth every 6 (six) hours as needed for moderate pain or headache.    [provider]  docusate sodium (COLACE) 100 MG capsule Take 1 capsule (100 mg total) by mouth 2 (two) times daily as needed for mild constipation or moderate constipation. Patient not taking: Reported on 04/27/2018 01/08/18   Anyanwu, Sallyanne Havers, MD  ibuprofen (ADVIL,MOTRIN) 600 MG tablet Take 1 tablet (600 mg total) by mouth every 6 (six) hours as needed for headache, mild pain, moderate pain or cramping. Patient not taking: Reported on 04/27/2018 01/08/18   Anyanwu, Sallyanne Havers, MD  ondansetron (ZOFRAN ODT) 4 MG disintegrating tablet Take 1 tablet (4 mg total) by mouth every 6 (six) hours as needed for nausea or vomiting. Patient not taking: Reported on 04/27/2018 01/08/18   Osborne Oman, MD  oxyCODONE-acetaminophen (PERCOCET/ROXICET) 5-325 MG tablet Take 1 tablet by mouth every 4 (four) hours as  needed for severe pain. Patient not taking: Reported on 04/27/2018 01/08/18   Osborne Oman, MD      Allergies    Penicillins    Review of Systems   Review of Systems  Cardiovascular:  Positive for syncope.   Physical Exam Updated Vital Signs BP 101/63   Pulse 68   Temp 98.2 F (36.8 C) (Oral)   Resp 16   Ht '5\' 2"'$  (1.575 m)   Wt 53.5 kg   LMP 12/31/2021 (Approximate)   SpO2 98%   BMI 21.58 kg/m  Physical Exam Vitals and nursing note reviewed.  Constitutional:      General: She is not in acute distress.    Appearance: She is well-developed. She is not diaphoretic.  HENT:     Head: Normocephalic and atraumatic.  Eyes:     Pupils: Pupils are equal, round, and reactive to light.  Cardiovascular:     Rate and Rhythm: Normal rate and regular rhythm.     Heart sounds: No murmur heard.   No friction rub. No gallop.  Pulmonary:     Effort: Pulmonary effort is normal.     Breath sounds: No wheezing or rales.  Abdominal:     General: There is no distension.     Palpations: Abdomen is soft.     Tenderness: There is no abdominal tenderness.  Musculoskeletal:        General: No tenderness.  Cervical back: Normal range of motion and neck supple.  Skin:    General: Skin is warm and dry.  Neurological:     Mental Status: She is alert and oriented to person, place, and time.  Psychiatric:        Behavior: Behavior normal.    ED Results / Procedures / Treatments   Labs (all labs ordered are listed, but only abnormal results are displayed) Labs Reviewed  CBC WITH DIFFERENTIAL/PLATELET - Abnormal; Notable for the following components:      Result Value   Platelets 147 (*)    All other components within normal limits  BASIC METABOLIC PANEL - Abnormal; Notable for the following components:   Glucose, Bld 129 (*)    All other components within normal limits  CBG MONITORING, ED - Abnormal; Notable for the following components:   Glucose-Capillary 104 (*)    All other  components within normal limits  PREGNANCY, URINE    EKG EKG Interpretation  Date/Time:  Monday Jan 07 2022 13:20:25 EDT Ventricular Rate:  75 PR Interval:  136 QRS Duration: 78 QT Interval:  376 QTC Calculation: 419 R Axis:   67 Text Interpretation: Normal sinus rhythm with sinus arrhythmia Normal ECG no wpw, prolonged qt or brugada No significant change since last tracing Confirmed by Deno Etienne [phone]) on 01/07/2022 1:28:05 PM  Radiology No results found.  Procedures Procedures    Medications Ordered in ED Medications - No data to display  ED Course/ Medical Decision Making/ A&P                           Medical Decision Making Amount and/or Complexity of Data Reviewed Labs: ordered.   21 yo F with a chief complaint of a syncopal event.  This occurred just prior to arrival.  The patient had gotten out of the shower and was walking down the hallway and felt bad and then passed out.  By history it sounds like she had a vasovagal reaction.  Sound similar to her last visit to the emergency department.  She was referred to cardiology but has not yet seen them in the office.  We will replace referral for her today.  Laboratory evaluation to assess for acute anemia or electrolyte abnormality pregnancy test.  Hemoglobin without anemia no significant electrolyte abnormality pregnancy test negative.  We will discharge the patient home.  PCP follow-up.  2:22 PM:  I have discussed the diagnosis/risks/treatment options with the patient and family.  Evaluation and diagnostic testing in the emergency department does not suggest an emergent condition requiring admission or immediate intervention beyond what has been performed at this time.  They will follow up with  Cards, PCP. We also discussed returning to the ED immediately if new or worsening sx occur. We discussed the sx which are most concerning (e.g., sudden worsening pain, fever, inability to tolerate by mouth) that necessitate  immediate return. Medications administered to the patient during their visit and any new prescriptions provided to the patient are listed below.  Medications given during this visit Medications - No data to display   The patient appears reasonably screen and/or stabilized for discharge and I doubt any other medical condition or other [facility] requiring further screening, evaluation, or treatment in the ED at this time prior to discharge.          Final Clinical Impression(s) / ED Diagnoses Final diagnoses:  Syncope and collapse    Rx / DC  Orders ED Discharge Orders          Ordered    Ambulatory referral to Cardiology       Comments: If you have not heard from the Cardiology office within the next 72 hours please call [phone].   01/07/22 [place], [place], DO 01/07/22 1422

## 2022-01-07 NOTE — ED Triage Notes (Addendum)
Patient states she got out of the shower pta and passed out, reports she did hit her head and was nauseated when she woke up. Reports feeling tired at this time. Patient does state she was seen for the same a few months ago. She does remember feeling hot in the shower prior to getting out. Patient is alert and oriented in triage.

## 2022-01-07 NOTE — Discharge Instructions (Signed)
You are not pregnant you are not losing blood you do not have a electrolyte abnormality.  I have placed an order for the cardiologist to call you to set up an appointment.  Eat and drink as well as you can for the next 48 hours.  Please return for chest pain difficulty breathing confusion or headache.

## 2022-03-04 NOTE — Progress Notes (Unsigned)
Cardiology Office Note:    Date:  03/05/2022   ID:  Beth Vaughn, DOB 18-Oct-2000, MRN [id number]  PCP:  [facility], [place]  Cardiologist:  Shirlee More, MD   Referring MD: Deno Etienne, DO  ASSESSMENT:    1. 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  I have asked her to drink enough liquids that her urine is clear salt her diet and we will place her on low-dose midodrine to mitigate these episodes and do an evaluation including 7-day 0 and echocardiogram to exclude any significant arrhythmogenic disorder or structural heart disease.  She will check her blood pressure twice daily and record starting midodrine and be back in the office in 6 weeks for follow-up.  Next appointment   Medication Adjustments/Labs and Tests Ordered: Current medicines are reviewed at length with the patient today.  Concerns regarding medicines are outlined above.  No orders of the defined types were placed in this encounter.  No orders of the defined types were placed in this encounter.    Chief Complaint  Patient presents with   Loss of Consciousness    History of Present Illness:    Beth Vaughn is a 21 y.o. female who is being seen today for the evaluation of syncope at the request of Deno Etienne, DO.  Seen in the emergency room both October 2022 in May of this year for syncope.  Both times the emergency room evaluation was unrevealing.  Her EKG 01/07/2022 sinus rhythm normal  She has a lifelong history of syncope onset age 75-8 she had frequent episodes when she was in elementary school and regressed and has recurred in the last years she recently had 2 episodes and presented to the emergency room for evaluation. She has a prodrome knows that she is going to faint but does not have enough time to place herself to the ground. On a couple episodes she has had urinary  incontinence she is not confused afterwards does not have a history of seizure disorder or ictal activity. There is a family history of cardiomyopathy her mother's had syncope. None of her episodes have been with physical activity and they are all brief. Past Medical History:  Diagnosis Date   Ovarian cyst     Past Surgical History:  Procedure Laterality Date   LAPAROSCOPIC OVARIAN CYSTECTOMY Right 01/08/2018   Procedure: LAPAROSCOPIC OVARIAN CYSTECTOMY;  Surgeon: Osborne Oman, MD;  Location: [facility];  Service: Gynecology;  Laterality: Right;   WISDOM TOOTH EXTRACTION      Current Medications: Current Meds  Medication Sig   ibuprofen (ADVIL,MOTRIN) 600 MG tablet Take 1 tablet (600 mg total) by mouth every 6 (six) hours as needed for headache, mild pain, moderate pain or cramping.     Allergies:   Penicillins   Social History   Socioeconomic History   Marital status: Single    Spouse name: Not on file   Number of children: Not on file   Years of education: Not on file   Highest education level: Not on file  Occupational History   Not on file  Tobacco Use   Smoking status: Never   Smokeless tobacco: Never  Vaping Use   Vaping Use: Never used  Substance and Sexual Activity   Alcohol use: Never   Drug use: Never   Sexual activity: Not on file  Other Topics Concern  Not on file  Social History Narrative   Not on file   Social Determinants of Health   Financial Resource Strain: Not on file  Food Insecurity: Not on file  Transportation Needs: Not on file  Physical Activity: Not on file  Stress: Not on file  Social Connections: Not on file     Family History: The patient's family history includes Hypertension in her mother. There is no history of Cancer or Diabetes.  ROS:   ROS Please see the history of present illness.     All other systems reviewed and are negative.  EKGs/Labs/Other Studies Reviewed:    The following studies were reviewed  today:     Recent Labs: 05/31/2021: ALT 101 01/07/2022: BUN 12; Creatinine, Ser 0.67; Hemoglobin 14.1; Platelets 147; Potassium 3.8; Sodium 137  Recent Lipid Panel No results found for: "CHOL", "TRIG", "HDL", "CHOLHDL", "VLDL", "LDLCALC", "LDLDIRECT"  Physical Exam:    VS:  BP 94/70 (BP Location: Right Arm, Patient Position: Sitting, Cuff Size: Normal)   Pulse (!) 58   Ht '5\' 2"'$  (1.575 m)   Wt 125 lb (56.7 kg)   SpO2 96%   BMI 22.86 kg/m     Wt Readings from Last 3 Encounters:  03/05/22 125 lb (56.7 kg)  01/07/22 118 lb (53.5 kg)  05/31/21 118 lb 9.7 oz (53.8 kg)     GEN:  Well nourished, well developed in no acute distress HEENT: Normal NECK: No JVD; No carotid bruits LYMPHATICS: No lymphadenopathy CARDIAC: RRR, no murmurs, rubs, gallops RESPIRATORY:  Clear to auscultation without rales, wheezing or rhonchi  ABDOMEN: Soft, non-tender, non-distended MUSCULOSKELETAL:  No edema; No deformity  SKIN: Warm and dry NEUROLOGIC:  Alert and oriented x 3 PSYCHIATRIC:  Normal affect     Signed, Shirlee More, MD  03/05/2022 2:22 PM    [place]

## 2022-03-05 ENCOUNTER — Encounter: Payer: Self-pay | Admitting: Cardiology

## 2022-03-05 ENCOUNTER — Ambulatory Visit (INDEPENDENT_AMBULATORY_CARE_PROVIDER_SITE_OTHER): Payer: Medicaid Other

## 2022-03-05 ENCOUNTER — Ambulatory Visit (INDEPENDENT_AMBULATORY_CARE_PROVIDER_SITE_OTHER): Payer: Medicaid Other | Admitting: Cardiology

## 2022-03-05 VITALS — BP 94/70 | HR 58 | Ht 62.0 in | Wt 125.0 lb

## 2022-03-05 DIAGNOSIS — R55 Syncope and collapse: Secondary | ICD-10-CM

## 2022-03-05 MED ORDER — MIDODRINE HCL 5 MG PO TABS: 5.0000 mg | ORAL_TABLET | Freq: Two times a day (BID) | ORAL | 3 refills | Status: AC

## 2022-03-05 NOTE — Patient Instructions (Addendum)
Medication Instructions:  Your physician has recommended you make the following change in your medication:   START: Midodrine 5 mg twice daily   *If you need a refill on your cardiac medications before your next appointment, please call your pharmacy*   Lab Work: None If you have labs (blood work) drawn today and your tests are completely normal, you will receive your results only by: [place] (if you have MyChart) OR A paper copy in the mail If you have any lab test that is abnormal or we need to change your treatment, we will call you to review the results.   Testing/Procedures: Your physician has requested that you have an echocardiogram. Echocardiography is a painless test that uses sound waves to create images of your heart. It provides your doctor with information about the size and shape of your heart and how well your heart's chambers and valves are working. This procedure takes approximately one hour. There are no restrictions for this procedure.  A zio monitor was ordered today. It will remain on for 7 days. You will then return monitor and event diary in provided box. It takes 1-2 weeks for report to be downloaded and returned to [place]. We will call you with the results. If monitor falls off or has orange flashing light, please call Zio for further instructions.     Follow-Up: At [facility], you and your health needs are our priority.  As part of our continuing mission to provide you with exceptional heart care, we have created designated Provider Care Teams.  These Care Teams include your primary Cardiologist (physician) and Advanced Practice Providers (APPs -  Physician Assistants and Nurse Practitioners) who all work together to provide you with the care you need, when you need it.  We recommend signing up for the patient portal called "MyChart".  Sign up information is provided on this After Visit Summary.  MyChart is used to connect with patients for Virtual Visits  (Telemedicine).  Patients are able to view lab/test results, encounter notes, upcoming appointments, etc.  Non-urgent messages can be sent to your provider as well.   To learn more about what you can do with MyChart, go to [email or web address].    Your next appointment:   6 week(s)  The format for your next appointment:   In Person  Provider:   Shirlee More, MD    Other Instructions Get a digital blood pressure cuff and record your blood pressure twice daily  Important Information About Sugar          [employer]  Tips to measure your blood pressure correctly  To determine whether you have hypertension, a medical professional will take a blood pressure reading. How you prepare for the test, the position of your arm, and other factors can change a blood pressure reading by 10% or more. That could be enough to hide high blood pressure, start you on a drug you don't really need, or lead your doctor to incorrectly adjust your medications. National and international guidelines offer specific instructions for measuring blood pressure. If a doctor, nurse, or medical assistant isn't doing it right, don't hesitate to ask him or her to get with the guidelines. Here's what you can do to ensure a correct reading:  Don't drink a caffeinated beverage or smoke during the 30 minutes before the test.  Sit quietly for five minutes before the test begins.  During the measurement, sit in a chair with your feet on the floor and your arm supported  so your elbow is at about heart level.  The inflatable part of the cuff should completely cover at least 80% of your upper arm, and the cuff should be placed on bare skin, not over a shirt.  Don't talk during the measurement.  Have your blood pressure measured twice, with a brief break in between. If the readings are different by 5 points or more, have it done a third time. There are times to break these rules. If you sometimes feel lightheaded when  getting out of bed in the morning or when you stand after sitting, you should have your blood pressure checked while seated and then while standing to see if it falls from one position to the next. Because blood pressure varies throughout the day, your doctor will rarely diagnose hypertension on the basis of a single reading. Instead, he or she will want to confirm the measurements on at least two occasions, usually within a few weeks of one another. The exception to this rule is if you have a blood pressure reading of 180/110 mm Hg or higher. A result this high usually calls for prompt treatment. It's also a good idea to have your blood pressure measured in both arms at least once, since the reading in one arm (usually the right) may be higher than that in the left. A 2014 study in The American Journal of Medicine of nearly 3,400 people found average arm- to-arm differences in systolic blood pressure of about 5 points. The higher number should be used to make treatment decisions. In 2017, new guidelines from the [employer], the [employer] of Cardiology, and nine other health organizations lowered the diagnosis of high blood pressure to 130/80 mm Hg or higher for all adults. The guidelines also redefined the various blood pressure categories to now include normal, elevated, Stage 1 hypertension, Stage 2 hypertension, and hypertensive crisis (see "Blood pressure categories"). Blood pressure categories  Blood pressure category SYSTOLIC (upper number)  DIASTOLIC (lower number)  Normal Less than 120 mm Hg and Less than 80 mm Hg  Elevated 120-129 mm Hg and Less than 80 mm Hg  High blood pressure: Stage 1 hypertension 130-139 mm Hg or 80-89 mm Hg  High blood pressure: Stage 2 hypertension 140 mm Hg or higher or 90 mm Hg or higher  Hypertensive crisis (consult your doctor immediately) Higher than 180 mm Hg and/or Higher than 120 mm Hg  Source: American Heart Association and American  Stroke Association. For more on getting your blood pressure under control, buy Controlling Your Blood Pressure, a Special Health Report from [facility].  Good pictorial instructions for techniques to avoid fainting/syncope  https://my.TownRank.com.cy.ashx?la=en   A good video discussion  [email or web address]

## 2022-03-26 ENCOUNTER — Ambulatory Visit (HOSPITAL_BASED_OUTPATIENT_CLINIC_OR_DEPARTMENT_OTHER)
Admission: RE | Admit: 2022-03-26 | Discharge: 2022-03-26 | Disposition: A | Discharge status: Home / Self Care | Payer: Medicaid Other | Source: Ambulatory Visit | Attending: Cardiology | Admitting: Cardiology

## 2022-03-26 DIAGNOSIS — R55 Syncope and collapse: Secondary | ICD-10-CM | POA: Insufficient documentation

## 2022-03-26 LAB — ECHOCARDIOGRAM COMPLETE
Area-P 1/2: 3.27 cm2
S' Lateral: 2.9 cm

## 2022-03-26 NOTE — Progress Notes (Signed)
  Echocardiogram 2D Echocardiogram has been performed.  Beth Vaughn F 03/26/2022, 9:49 AM

## 2022-04-03 ENCOUNTER — Telehealth: Payer: Self-pay

## 2022-04-03 NOTE — Telephone Encounter (Signed)
-----   Message from Richardo Priest, MD sent at 04/02/2022  9:17 PM EDT ----- Normal or stable result

## 2022-04-03 NOTE — Telephone Encounter (Signed)
Patient notified

## 2022-04-18 DIAGNOSIS — N83209 Unspecified ovarian cyst, unspecified side: Secondary | ICD-10-CM | POA: Insufficient documentation

## 2022-04-21 NOTE — Progress Notes (Deleted)
Cardiology Office Note:    Date:  04/21/2022   ID:  Beth Vaughn, DOB 2001/07/01, MRN [id number]  PCP:  [facility] [place]  Cardiologist:  Shirlee More, MD    Referring MD: [facility] [place]    ASSESSMENT:    No diagnosis found. PLAN:    In order of problems listed above:  ***   Next appointment: ***   Medication Adjustments/Labs and Tests Ordered: Current medicines are reviewed at length with the patient today.  Concerns regarding medicines are outlined above.  No orders of the defined types were placed in this encounter.  No orders of the defined types were placed in this encounter.   No chief complaint on file.   History of Present Illness:    Beth Vaughn is a 21 y.o. female with a hx of recurrent neurocardiogenic syncope with baseline hypotension last seen 03/05/2022 and initiated on midodrine.  Valuation included an echocardiogram showing normal left ventricular size wall thickness systolic diastolic function normal right ventricular size function and pulmonary artery pressure and no valvular abnormality.  Ovulatory event monitor showed rare supraventricular and ventricular ectopy and one isolated instance of Mobitz 1 second-degree AV block. Compliance with diet, lifestyle and medications: *** Past Medical History:  Diagnosis Date   Ovarian cyst     Past Surgical History:  Procedure Laterality Date   LAPAROSCOPIC OVARIAN CYSTECTOMY Right 01/08/2018   Procedure: LAPAROSCOPIC OVARIAN CYSTECTOMY;  Surgeon: Osborne Oman, MD;  Location: [facility];  Service: Gynecology;  Laterality: Right;   WISDOM TOOTH EXTRACTION      Current Medications: No outpatient medications have been marked as taking for the 04/22/22 encounter (Appointment) with Richardo Priest, MD.     Allergies:   Penicillins   Social History   Socioeconomic History   Marital status: Single    Spouse name: Not on file   Number of children: Not on file   Years of education:  Not on file   Highest education level: Not on file  Occupational History   Not on file  Tobacco Use   Smoking status: Never   Smokeless tobacco: Never  Vaping Use   Vaping Use: Never used  Substance and Sexual Activity   Alcohol use: Never   Drug use: Never   Sexual activity: Not on file  Other Topics Concern   Not on file  Social History Narrative   Not on file   Social Determinants of Health   Financial Resource Strain: Not on file  Food Insecurity: Not on file  Transportation Needs: Not on file  Physical Activity: Not on file  Stress: Not on file  Social Connections: Not on file     Family History: The patient's ***family history includes Hypertension in her mother. There is no history of Cancer or Diabetes. ROS:   Please see the history of present illness.    All other systems reviewed and are negative.  EKGs/Labs/Other Studies Reviewed:    The following studies were reviewed today:  EKG:  EKG ordered today and personally reviewed.  The ekg ordered today demonstrates ***  Recent Labs: 05/31/2021: ALT 101 01/07/2022: BUN 12; Creatinine, Ser 0.67; Hemoglobin 14.1; Platelets 147; Potassium 3.8; Sodium 137  Recent Lipid Panel No results found for: "CHOL", "TRIG", "HDL", "CHOLHDL", "VLDL", "LDLCALC", "LDLDIRECT"  Physical Exam:    VS:  There were no vitals taken for this visit.    Wt Readings from Last 3 Encounters:  03/05/22 125 lb (56.7 kg)  01/07/22 118 lb (  53.5 kg)  05/31/21 118 lb 9.7 oz (53.8 kg)     GEN: *** Well nourished, well developed in no acute distress HEENT: Normal NECK: No JVD; No carotid bruits LYMPHATICS: No lymphadenopathy CARDIAC: ***RRR, no murmurs, rubs, gallops RESPIRATORY:  Clear to auscultation without rales, wheezing or rhonchi  ABDOMEN: Soft, non-tender, non-distended MUSCULOSKELETAL:  No edema; No deformity  SKIN: Warm and dry NEUROLOGIC:  Alert and oriented x 3 PSYCHIATRIC:  Normal affect    Signed, Shirlee More, MD   04/21/2022 6:49 PM    [place]

## 2022-04-22 ENCOUNTER — Ambulatory Visit: Payer: Medicaid Other | Admitting: Cardiology

## 2022-04-25 ENCOUNTER — Encounter: Payer: Self-pay | Admitting: Cardiovascular Disease

## 2022-04-25 NOTE — Progress Notes (Signed)
This encounter was created in error - please disregard.

## 2022-04-26 ENCOUNTER — Ambulatory Visit: Payer: Medicaid Other | Attending: Cardiovascular Disease | Admitting: Cardiovascular Disease
